# Patient Record
Sex: Female | Born: 1990 | Race: Black or African American | Hispanic: No | Marital: Single | Smoking: Never smoker
Health system: Southern US, Community
[De-identification: ages and names within clinical notes are randomized; demographics above are authoritative.]

## PROBLEM LIST (undated history)

## (undated) ENCOUNTER — Inpatient Hospital Stay (HOSPITAL_COMMUNITY): Payer: Self-pay

## (undated) DIAGNOSIS — D649 Anemia, unspecified: Secondary | ICD-10-CM

## (undated) HISTORY — PX: UMBILICAL HERNIA REPAIR: SHX196

## (undated) HISTORY — PX: OOPHORECTOMY: SHX86

---
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
## 2008-06-23 ENCOUNTER — Emergency Department (HOSPITAL_COMMUNITY): Admission: EM | Admit: 2008-06-23 | Discharge: 2008-06-23 | Payer: Self-pay | Admitting: Emergency Medicine

## 2009-12-01 ENCOUNTER — Inpatient Hospital Stay (HOSPITAL_COMMUNITY): Admission: AD | Admit: 2009-12-01 | Discharge: 2009-12-01 | Payer: Self-pay | Admitting: Obstetrics and Gynecology

## 2010-02-04 ENCOUNTER — Inpatient Hospital Stay (HOSPITAL_COMMUNITY): Admission: AD | Admit: 2010-02-04 | Discharge: 2010-02-04 | Payer: Self-pay | Admitting: Obstetrics and Gynecology

## 2010-02-13 ENCOUNTER — Inpatient Hospital Stay (HOSPITAL_COMMUNITY): Admission: AD | Admit: 2010-02-13 | Discharge: 2010-02-15 | Payer: Self-pay | Admitting: Obstetrics and Gynecology

## 2010-04-30 ENCOUNTER — Emergency Department (HOSPITAL_COMMUNITY): Admission: EM | Admit: 2010-04-30 | Discharge: 2009-12-17 | Payer: Self-pay | Admitting: Emergency Medicine

## 2010-08-06 LAB — CBC
HCT: 31.2 % — ABNORMAL LOW (ref 36.0–46.0)
MCH: 24.5 pg — ABNORMAL LOW (ref 26.0–34.0)
MCHC: 33.4 g/dL (ref 30.0–36.0)
MCV: 73.4 fL — ABNORMAL LOW (ref 78.0–100.0)
RDW: 16.6 % — ABNORMAL HIGH (ref 11.5–15.5)

## 2010-08-09 LAB — WET PREP, GENITAL: Yeast Wet Prep HPF POC: NONE SEEN

## 2011-02-15 LAB — CBC
HCT: 30.1 — ABNORMAL LOW
Hemoglobin: 12
MCV: 75.1 — ABNORMAL LOW
MCV: 75.8 — ABNORMAL LOW
Platelets: 201
RBC: 4.88
RDW: 15.4
WBC: 12.2 — ABNORMAL HIGH

## 2014-05-24 NOTE — L&D Delivery Note (Signed)
Pt was admitted after two decels were noted in the office. She was admitted and started on Pit. She had AROM. She followed a nl labor curve. She pushed for 20 min. She had a SVD of one live viable black infant over an intact perineum in the ROA position. Placenta S/I. Uterine atony was txd with massage/pit/methergine . Baby to NBN. True knot noted in the umbilical cord.

## 2014-07-02 ENCOUNTER — Encounter (HOSPITAL_COMMUNITY): Payer: Self-pay

## 2014-07-02 ENCOUNTER — Emergency Department (HOSPITAL_COMMUNITY)
Admission: EM | Admit: 2014-07-02 | Discharge: 2014-07-02 | Disposition: A | Discharge status: Home / Self Care | Payer: Medicaid Other | Attending: Emergency Medicine | Admitting: Emergency Medicine

## 2014-07-02 DIAGNOSIS — Z3A01 Less than 8 weeks gestation of pregnancy: Secondary | ICD-10-CM | POA: Insufficient documentation

## 2014-07-02 DIAGNOSIS — O9A211 Injury, poisoning and certain other consequences of external causes complicating pregnancy, first trimester: Secondary | ICD-10-CM | POA: Insufficient documentation

## 2014-07-02 DIAGNOSIS — S3993XA Unspecified injury of pelvis, initial encounter: Secondary | ICD-10-CM | POA: Insufficient documentation

## 2014-07-02 DIAGNOSIS — Y92410 Unspecified street and highway as the place of occurrence of the external cause: Secondary | ICD-10-CM | POA: Diagnosis not present

## 2014-07-02 DIAGNOSIS — Z79899 Other long term (current) drug therapy: Secondary | ICD-10-CM | POA: Diagnosis not present

## 2014-07-02 DIAGNOSIS — R102 Pelvic and perineal pain: Secondary | ICD-10-CM

## 2014-07-02 DIAGNOSIS — O26899 Other specified pregnancy related conditions, unspecified trimester: Secondary | ICD-10-CM

## 2014-07-02 DIAGNOSIS — Y998 Other external cause status: Secondary | ICD-10-CM | POA: Insufficient documentation

## 2014-07-02 DIAGNOSIS — Y9389 Activity, other specified: Secondary | ICD-10-CM | POA: Insufficient documentation

## 2014-07-02 MED ORDER — ACETAMINOPHEN 325 MG PO TABS
650.0000 mg | ORAL_TABLET | Freq: Once | ORAL | Status: AC
  Administered 2014-07-02: 650 mg via ORAL
  Filled 2014-07-02: qty 2
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
## 2014-07-02 NOTE — Discharge Instructions (Signed)
If you were given medicines take as directed.  If you are on coumadin or contraceptives realize their levels and effectiveness is altered by many different medicines.  If you have any reaction (rash, tongues swelling, other) to the medicines stop taking and see a physician.   Please follow up as directed and return to the ER or see a physician for new or worsening symptoms.  Thank you. Filed Vitals:   07/02/14 1120 07/02/14 1346  BP: 125/60 109/62  Pulse: 96 89  Temp: 98.1 F (36.7 C) 98.4 F (36.9 C)  TempSrc: Oral Oral  Resp: 16 16  SpO2: 99% 100%

## 2014-07-02 NOTE — ED Notes (Signed)
Pt alert x4 respirations easy non labored.  

## 2014-08-06 LAB — OB RESULTS CONSOLE GC/CHLAMYDIA
Chlamydia: NEGATIVE
GC PROBE AMP, GENITAL: NEGATIVE

## 2014-08-06 LAB — OB RESULTS CONSOLE ANTIBODY SCREEN: ANTIBODY SCREEN: NEGATIVE

## 2014-08-06 LAB — OB RESULTS CONSOLE ABO/RH

## 2014-08-06 LAB — OB RESULTS CONSOLE HGB/HCT, BLOOD
HCT: 35 %
Hemoglobin: 10.7 g/dL

## 2014-08-06 LAB — OB RESULTS CONSOLE RUBELLA ANTIBODY, IGM: RUBELLA: IMMUNE

## 2014-08-06 LAB — OB RESULTS CONSOLE HIV ANTIBODY (ROUTINE TESTING): HIV: NONREACTIVE

## 2014-08-06 LAB — OB RESULTS CONSOLE PLATELET COUNT: PLATELETS: 215 10*3/uL

## 2014-08-06 LAB — OB RESULTS CONSOLE HEPATITIS B SURFACE ANTIGEN: Hepatitis B Surface Ag: NEGATIVE

## 2014-08-06 LAB — OB RESULTS CONSOLE RPR: RPR: NONREACTIVE

## 2014-09-25 LAB — OB RESULTS CONSOLE PLATELET COUNT: Platelets: 232 10*3/uL

## 2014-09-25 LAB — OB RESULTS CONSOLE HGB/HCT, BLOOD
HEMATOCRIT: 30 %
Hemoglobin: 10 g/dL

## 2014-09-25 LAB — OB RESULTS CONSOLE RPR: RPR: NONREACTIVE

## 2014-11-14 LAB — OB RESULTS CONSOLE GBS: GBS: NEGATIVE

## 2014-11-19 ENCOUNTER — Inpatient Hospital Stay (HOSPITAL_COMMUNITY)
Admission: AD | Admit: 2014-11-19 | Discharge: 2014-11-19 | Disposition: A | Discharge status: Home / Self Care | Payer: Medicaid Other | Source: Ambulatory Visit | Attending: Obstetrics and Gynecology | Admitting: Obstetrics and Gynecology

## 2014-11-19 ENCOUNTER — Encounter (HOSPITAL_COMMUNITY): Payer: Self-pay | Admitting: *Deleted

## 2014-11-19 DIAGNOSIS — K529 Noninfective gastroenteritis and colitis, unspecified: Secondary | ICD-10-CM

## 2014-11-19 DIAGNOSIS — Z3A35 35 weeks gestation of pregnancy: Secondary | ICD-10-CM | POA: Insufficient documentation

## 2014-11-19 DIAGNOSIS — A084 Viral intestinal infection, unspecified: Secondary | ICD-10-CM | POA: Diagnosis not present

## 2014-11-19 DIAGNOSIS — O212 Late vomiting of pregnancy: Secondary | ICD-10-CM | POA: Diagnosis present

## 2014-11-19 DIAGNOSIS — O99613 Diseases of the digestive system complicating pregnancy, third trimester: Secondary | ICD-10-CM | POA: Insufficient documentation

## 2014-11-19 HISTORY — DX: Anemia, unspecified: D64.9

## 2014-11-19 LAB — URINALYSIS, ROUTINE W REFLEX MICROSCOPIC
Bilirubin Urine: NEGATIVE
GLUCOSE, UA: NEGATIVE mg/dL
Hgb urine dipstick: NEGATIVE
KETONES UR: NEGATIVE mg/dL
Nitrite: NEGATIVE
PH: 7.5 (ref 5.0–8.0)
Protein, ur: NEGATIVE mg/dL
SPECIFIC GRAVITY, URINE: 1.015 (ref 1.005–1.030)
Urobilinogen, UA: 0.2 mg/dL (ref 0.0–1.0)

## 2014-11-19 LAB — URINE MICROSCOPIC-ADD ON

## 2014-11-19 MED ORDER — PROMETHAZINE HCL 25 MG PO TABS: 25.0000 mg | ORAL_TABLET | Freq: Four times a day (QID) | ORAL | Status: DC | PRN

## 2014-11-19 MED ORDER — METOCLOPRAMIDE HCL 5 MG/ML IJ SOLN
10.0000 mg | Freq: Once | INTRAMUSCULAR | Status: AC
  Administered 2014-11-19: 10 mg via INTRAVENOUS
  Filled 2014-11-19: qty 2

## 2014-11-19 MED ORDER — LACTATED RINGERS IV BOLUS (SEPSIS)
1000.0000 mL | Freq: Once | INTRAVENOUS | Status: AC
  Administered 2014-11-19: 1000 mL via INTRAVENOUS

## 2014-11-19 MED ORDER — ONDANSETRON 8 MG PO TBDP
8.0000 mg | ORAL_TABLET | Freq: Once | ORAL | Status: AC
  Administered 2014-11-19: 8 mg via ORAL
  Filled 2014-11-19: qty 1

## 2014-11-19 NOTE — MAU Note (Signed)
Pt sitting up in bed and FHR not recording well.

## 2014-11-19 NOTE — Progress Notes (Signed)
Pt vomited after drinking ginger ale. No diarrhea since admission

## 2014-11-19 NOTE — MAU Note (Signed)
Unable to obtain urine for u/a currently

## 2014-11-19 NOTE — Progress Notes (Signed)
Ginger ale to pt

## 2014-11-19 NOTE — MAU Note (Signed)
N/v since last THurs. Diarrhea started Friday. Unable to keep down anything. Some pelvic pressure and lower back pain. No one else has been sick with similar symptoms

## 2014-11-19 NOTE — Progress Notes (Signed)
Written and verbal d/c instructions given and understanding voiced. 

## 2014-11-19 NOTE — MAU Note (Signed)
Pt up to BR. OK to d/c EFM per S. Chase PicketLineberry NP

## 2014-11-19 NOTE — MAU Provider Note (Signed)
History     CSN: [id number]  Arrival date and time: 11/19/14 1051   None     Chief Complaint  Patient presents with  . Emesis During Pregnancy  . Diarrhea   HPIpt is G4P3 [redacted]w[redacted]d pregnant who presents with N/V/D along with pelvic pressure and lower back pain. Pt denies bleeding or LOF RN note:  MAU Note 11/19/2014 11:37 AM    Expand All Collapse All   N/v since last THurs. Diarrhea started Friday. Unable to keep down anything. Some pelvic pressure and lower back pain. No one else has been sick with similar symptoms         Past Medical History  Diagnosis Date  . Anemia     Past Surgical History  Procedure Laterality Date  . Umbilical hernia repair      Family History  Problem Relation Age of Onset  . Hypertension Mother   . Asthma Daughter     History  Substance Use Topics  . Smoking status: Never Smoker   . Smokeless tobacco: Not on file  . Alcohol Use: No    Allergies: No Known Allergies  Prescriptions prior to admission  Medication Sig Dispense Refill Last Dose  . ferrous sulfate 325 (65 FE) MG tablet Take 325 mg by mouth daily with breakfast.   11/13/2014  . Multiple Vitamins-Minerals (MULTI ADULT GUMMIES PO) Take 1 capsule by mouth daily.   11/13/2014    Review of Systems  Constitutional: Negative for fever and chills.  Gastrointestinal: Positive for nausea, vomiting and diarrhea. Negative for abdominal pain.  Genitourinary: Negative for dysuria, urgency and frequency.  Neurological: Negative for headaches.   Physical Exam   Blood pressure 126/73, pulse 108, temperature 98.5 F (36.9 C), resp. rate 18, height  (1.702 m), weight 217 lb (98.431 kg).  Physical Exam  Nursing note and vitals reviewed. Constitutional: She is oriented to person, place, and time. She appears well-developed and well-nourished. No distress.  HENT:  Head: Normocephalic.  Eyes: Pupils are equal, round, and reactive to light.  Neck: Normal range of motion. Neck  supple.  Cardiovascular: Normal rate.   Respiratory: Effort normal.  GI: Soft. She exhibits no distension. There is no tenderness. There is no guarding.  Musculoskeletal: Normal range of motion.  Neurological: She is alert and oriented to person, place, and time.  Skin: Skin is warm and dry.  Psychiatric: She has a normal mood and affect.    MAU Course  Procedures IV LR Zofran  IV- still nauseated vomited  gingerale Reglan  IV- pt felt better No diarrhea since being in MAU Reactive NST ; some irritability Results for orders placed or performed during the hospital encounter of 11/19/14 (from the past 24 hour(s))  Urinalysis, Routine w reflex microscopic (not at [facility])     Status: Abnormal   Collection Time: 11/19/14  1:45 PM  Result Value Ref Range   Color, Urine YELLOW YELLOW   APPearance CLEAR CLEAR   Specific Gravity, Urine 1.015 1.005 - 1.030   pH 7.5 5.0 - 8.0   Glucose, UA NEGATIVE NEGATIVE mg/dL   Hgb urine dipstick NEGATIVE NEGATIVE   Bilirubin Urine NEGATIVE NEGATIVE   Ketones, ur NEGATIVE NEGATIVE mg/dL   Protein, ur NEGATIVE NEGATIVE mg/dL   Urobilinogen, UA 0.2 0.0 - 1.0 mg/dL   Nitrite NEGATIVE NEGATIVE   Leukocytes, UA TRACE (A) NEGATIVE  Urine microscopic-add on     Status: Abnormal   Collection Time: 11/19/14  1:45 PM  Result Value Ref  Range   Squamous Epithelial / LPF FEW (A) RARE   WBC, UA 0-2 <3 WBC/hpf   Bacteria, UA FEW (A) RARE  discussed with Dr. Henderson CloudHorvath Assessment and Plan  Viral gastroenteritis- phenergan 25mg  Rx if needed BRAT diet F/u with OB appointment on thursday   Hall,Meagan 11/19/2014, 11:49 AM

## 2014-12-06 LAB — OB RESULTS CONSOLE ABO/RH: RH TYPE: POSITIVE

## 2014-12-20 ENCOUNTER — Inpatient Hospital Stay (HOSPITAL_COMMUNITY): Payer: Medicaid Other | Admitting: Anesthesiology

## 2014-12-20 ENCOUNTER — Encounter (HOSPITAL_COMMUNITY): Payer: Self-pay

## 2014-12-20 ENCOUNTER — Inpatient Hospital Stay (HOSPITAL_COMMUNITY)
Admission: AD | Admit: 2014-12-20 | Discharge: 2014-12-22 | DRG: 775 | Disposition: A | Discharge status: Home / Self Care | Payer: Medicaid Other | Source: Ambulatory Visit | Attending: Obstetrics and Gynecology | Admitting: Obstetrics and Gynecology

## 2014-12-20 DIAGNOSIS — Z3480 Encounter for supervision of other normal pregnancy, unspecified trimester: Secondary | ICD-10-CM

## 2014-12-20 DIAGNOSIS — Z3A40 40 weeks gestation of pregnancy: Secondary | ICD-10-CM | POA: Diagnosis present

## 2014-12-20 DIAGNOSIS — Z3490 Encounter for supervision of normal pregnancy, unspecified, unspecified trimester: Secondary | ICD-10-CM

## 2014-12-20 LAB — CBC
HCT: 28.5 % — ABNORMAL LOW (ref 36.0–46.0)
Hemoglobin: 9.1 g/dL — ABNORMAL LOW (ref 12.0–15.0)
MCH: 20.9 pg — ABNORMAL LOW (ref 26.0–34.0)
MCHC: 31.9 g/dL (ref 30.0–36.0)
MCV: 65.4 fL — AB (ref 78.0–100.0)
Platelets: 174 10*3/uL (ref 150–400)
RBC: 4.36 MIL/uL (ref 3.87–5.11)
RDW: 17.7 % — AB (ref 11.5–15.5)
WBC: 7.8 10*3/uL (ref 4.0–10.5)

## 2014-12-20 LAB — TYPE AND SCREEN
ABO/RH(D): O POS
Antibody Screen: NEGATIVE

## 2014-12-20 LAB — ABO/RH: ABO/RH(D): O POS

## 2014-12-20 MED ORDER — CITRIC ACID-SODIUM CITRATE 334-500 MG/5ML PO SOLN: 30.0000 mL | ORAL | Status: DC | PRN

## 2014-12-20 MED ORDER — OXYTOCIN 40 UNITS IN LACTATED RINGERS INFUSION - SIMPLE MED: 62.5000 mL/h | INTRAVENOUS | Status: DC

## 2014-12-20 MED ORDER — OXYTOCIN 40 UNITS IN LACTATED RINGERS INFUSION - SIMPLE MED
1.0000 m[IU]/min | INTRAVENOUS | Status: DC
  Administered 2014-12-20: 2 m[IU]/min via INTRAVENOUS
  Filled 2014-12-20: qty 1000

## 2014-12-20 MED ORDER — PHENYLEPHRINE 40 MCG/ML (10ML) SYRINGE FOR IV PUSH (FOR BLOOD PRESSURE SUPPORT)
80.0000 ug | PREFILLED_SYRINGE | INTRAVENOUS | Status: DC | PRN
  Filled 2014-12-20: qty 2
  Filled 2014-12-20: qty 20

## 2014-12-20 MED ORDER — LACTATED RINGERS IV SOLN
INTRAVENOUS | Status: DC
  Administered 2014-12-20 (×2): via INTRAVENOUS

## 2014-12-20 MED ORDER — ONDANSETRON HCL 4 MG/2ML IJ SOLN: 4.0000 mg | Freq: Four times a day (QID) | INTRAMUSCULAR | Status: DC | PRN

## 2014-12-20 MED ORDER — OXYCODONE-ACETAMINOPHEN 5-325 MG PO TABS: 2.0000 | ORAL_TABLET | ORAL | Status: DC | PRN

## 2014-12-20 MED ORDER — LIDOCAINE HCL (PF) 1 % IJ SOLN
INTRAMUSCULAR | Status: DC | PRN
  Administered 2014-12-20 (×2): 8 mL via EPIDURAL

## 2014-12-20 MED ORDER — EPHEDRINE 5 MG/ML INJ
10.0000 mg | INTRAVENOUS | Status: DC | PRN
  Filled 2014-12-20: qty 2

## 2014-12-20 MED ORDER — OXYTOCIN BOLUS FROM INFUSION: 500.0000 mL | INTRAVENOUS | Status: DC

## 2014-12-20 MED ORDER — ACETAMINOPHEN 325 MG PO TABS: 650.0000 mg | ORAL_TABLET | ORAL | Status: DC | PRN

## 2014-12-20 MED ORDER — LACTATED RINGERS IV SOLN: 500.0000 mL | INTRAVENOUS | Status: DC | PRN

## 2014-12-20 MED ORDER — FENTANYL 2.5 MCG/ML BUPIVACAINE 1/10 % EPIDURAL INFUSION (WH - ANES)
14.0000 mL/h | INTRAMUSCULAR | Status: DC | PRN
  Administered 2014-12-20 – 2014-12-21 (×2): 14 mL/h via EPIDURAL
  Filled 2014-12-20 (×2): qty 125

## 2014-12-20 MED ORDER — LIDOCAINE HCL (PF) 1 % IJ SOLN
30.0000 mL | INTRAMUSCULAR | Status: DC | PRN
  Filled 2014-12-20: qty 30

## 2014-12-20 MED ORDER — DIPHENHYDRAMINE HCL 50 MG/ML IJ SOLN: 12.5000 mg | INTRAMUSCULAR | Status: DC | PRN

## 2014-12-20 MED ORDER — OXYCODONE-ACETAMINOPHEN 5-325 MG PO TABS: 1.0000 | ORAL_TABLET | ORAL | Status: DC | PRN

## 2014-12-20 MED ORDER — TERBUTALINE SULFATE 1 MG/ML IJ SOLN: 0.2500 mg | Freq: Once | INTRAMUSCULAR | Status: AC | PRN

## 2014-12-20 NOTE — Progress Notes (Signed)
Light brown noted on towel- RN changed towel- and will assess. Not wet- no fluid noted.

## 2014-12-20 NOTE — Anesthesia Procedure Notes (Signed)
Epidural Patient location during procedure: OB Start time: 12/20/2014 11:24 PM End time: 12/20/2014 11:28 PM  Staffing Anesthesiologist: Leilani Able Performed by: anesthesiologist   Preanesthetic Checklist Completed: patient identified, surgical consent, pre-op evaluation, timeout performed, IV checked, risks and benefits discussed and monitors and equipment checked  Epidural Patient position: sitting Prep: site prepped and draped and DuraPrep Patient monitoring: continuous pulse ox and blood pressure Approach: midline Location: L3-L4 Injection technique: LOR air  Needle:  Needle type: Tuohy  Needle gauge: 17 G Needle length: 9 cm and 9 Needle insertion depth: 6 cm Catheter type: closed end flexible Catheter size: 19 Gauge Catheter at skin depth: 11 cm Test dose: negative and Other  Assessment Sensory level: T9 Events: blood not aspirated, injection not painful, no injection resistance, negative IV test and no paresthesia  Additional Notes Reason for block:procedure for pain

## 2014-12-20 NOTE — Anesthesia Preprocedure Evaluation (Signed)
Anesthesia Evaluation  Patient identified by MRN, date of birth, ID band Patient awake    Reviewed: Allergy & Precautions, H&P , NPO status , Patient's Chart, lab work & pertinent test results  Airway Mallampati: II  TM Distance: >3 FB Neck ROM: full    Dental no notable dental hx. (+) Teeth Intact   Pulmonary neg pulmonary ROS,    Pulmonary exam normal       Cardiovascular negative cardio ROS Normal cardiovascular exam    Neuro/Psych negative neurological ROS  negative psych ROS   GI/Hepatic negative GI ROS, Neg liver ROS,   Endo/Other  negative endocrine ROS  Renal/GU negative Renal ROS     Musculoskeletal   Abdominal (+) + obese,   Peds  Hematology negative hematology ROS (+)   Anesthesia Other Findings   Reproductive/Obstetrics negative OB ROS (+) Pregnancy                             Anesthesia Physical Anesthesia Plan  ASA: II  Anesthesia Plan: Epidural   Post-op Pain Management:    Induction:   Airway Management Planned:   Additional Equipment:   Intra-op Plan:   Post-operative Plan:   Informed Consent: I have reviewed the patients History and Physical, chart, labs and discussed the procedure including the risks, benefits and alternatives for the proposed anesthesia with the patient or authorized representative who has indicated his/her understanding and acceptance.     Plan Discussed with:   Anesthesia Plan Comments:         Anesthesia Quick Evaluation

## 2014-12-20 NOTE — H&P (Signed)
Pt is a 24 y/o black female who is admitted for an induction because she had two decels on an NST today. PNC was uncomplicated. PMHX: see PNR PE: VSSAF        HEENT-wnl        ABD- gravid, non tender        FHTS- no reactive IMP/ IUP at term         Decels on NST PLAN/ Admit            Start Induction

## 2014-12-21 ENCOUNTER — Encounter (HOSPITAL_COMMUNITY): Payer: Self-pay | Admitting: *Deleted

## 2014-12-21 DIAGNOSIS — Z3480 Encounter for supervision of other normal pregnancy, unspecified trimester: Secondary | ICD-10-CM

## 2014-12-21 LAB — RPR: RPR Ser Ql: NONREACTIVE

## 2014-12-21 MED ORDER — ZOLPIDEM TARTRATE 5 MG PO TABS: 5.0000 mg | ORAL_TABLET | Freq: Every evening | ORAL | Status: DC | PRN

## 2014-12-21 MED ORDER — ONDANSETRON HCL 4 MG/2ML IJ SOLN: 4.0000 mg | INTRAMUSCULAR | Status: DC | PRN

## 2014-12-21 MED ORDER — SENNOSIDES-DOCUSATE SODIUM 8.6-50 MG PO TABS
2.0000 | ORAL_TABLET | ORAL | Status: DC
  Administered 2014-12-21: 2 via ORAL
  Filled 2014-12-21: qty 2

## 2014-12-21 MED ORDER — IBUPROFEN 600 MG PO TABS
600.0000 mg | ORAL_TABLET | Freq: Four times a day (QID) | ORAL | Status: DC
  Administered 2014-12-21 – 2014-12-22 (×5): 600 mg via ORAL
  Filled 2014-12-21 (×5): qty 1

## 2014-12-21 MED ORDER — WITCH HAZEL-GLYCERIN EX PADS: 1.0000 "application " | MEDICATED_PAD | CUTANEOUS | Status: DC | PRN

## 2014-12-21 MED ORDER — MISOPROSTOL 200 MCG PO TABS
ORAL_TABLET | ORAL | Status: AC
  Filled 2014-12-21: qty 5

## 2014-12-21 MED ORDER — TETANUS-DIPHTH-ACELL PERTUSSIS 5-2.5-18.5 LF-MCG/0.5 IM SUSP: 0.5000 mL | Freq: Once | INTRAMUSCULAR | Status: DC

## 2014-12-21 MED ORDER — ONDANSETRON HCL 4 MG PO TABS: 4.0000 mg | ORAL_TABLET | ORAL | Status: DC | PRN

## 2014-12-21 MED ORDER — DIBUCAINE 1 % RE OINT: 1.0000 "application " | TOPICAL_OINTMENT | RECTAL | Status: DC | PRN

## 2014-12-21 MED ORDER — MEASLES, MUMPS & RUBELLA VAC ~~LOC~~ INJ
0.5000 mL | INJECTION | Freq: Once | SUBCUTANEOUS | Status: DC
  Filled 2014-12-21: qty 0.5

## 2014-12-21 MED ORDER — ACETAMINOPHEN 325 MG PO TABS
650.0000 mg | ORAL_TABLET | ORAL | Status: DC | PRN
Start: 2014-12-21 — End: 2014-12-22

## 2014-12-21 MED ORDER — SIMETHICONE 80 MG PO CHEW: 80.0000 mg | CHEWABLE_TABLET | ORAL | Status: DC | PRN

## 2014-12-21 MED ORDER — METHYLERGONOVINE MALEATE 0.2 MG/ML IJ SOLN
INTRAMUSCULAR | Status: AC
  Administered 2014-12-21: 0.2 mg
  Filled 2014-12-21: qty 1

## 2014-12-21 MED ORDER — BENZOCAINE-MENTHOL 20-0.5 % EX AERO: 1.0000 "application " | INHALATION_SPRAY | CUTANEOUS | Status: DC | PRN

## 2014-12-21 MED ORDER — OXYCODONE-ACETAMINOPHEN 5-325 MG PO TABS: 2.0000 | ORAL_TABLET | ORAL | Status: DC | PRN

## 2014-12-21 MED ORDER — CARBOPROST TROMETHAMINE 250 MCG/ML IM SOLN
INTRAMUSCULAR | Status: AC
  Filled 2014-12-21: qty 1

## 2014-12-21 MED ORDER — OXYCODONE-ACETAMINOPHEN 5-325 MG PO TABS
1.0000 | ORAL_TABLET | ORAL | Status: DC | PRN
  Administered 2014-12-21: 1 via ORAL
  Filled 2014-12-21: qty 1

## 2014-12-22 LAB — CBC
HCT: 23.3 % — ABNORMAL LOW (ref 36.0–46.0)
Hemoglobin: 7.4 g/dL — ABNORMAL LOW (ref 12.0–15.0)
MCH: 20.8 pg — AB (ref 26.0–34.0)
MCHC: 31.8 g/dL (ref 30.0–36.0)
MCV: 65.4 fL — AB (ref 78.0–100.0)
Platelets: 161 10*3/uL (ref 150–400)
RBC: 3.56 MIL/uL — AB (ref 3.87–5.11)
RDW: 17.7 % — ABNORMAL HIGH (ref 11.5–15.5)
WBC: 10.6 10*3/uL — ABNORMAL HIGH (ref 4.0–10.5)

## 2014-12-22 MED ORDER — OXYCODONE-ACETAMINOPHEN 5-325 MG PO TABS: 1.0000 | ORAL_TABLET | ORAL | Status: DC | PRN

## 2014-12-22 NOTE — Progress Notes (Signed)
PPD#1 Pt is doing well She would like to go home.  Plan/ Will discharge.

## 2014-12-22 NOTE — Progress Notes (Signed)
Discharge teaching complete. Pt understood all instructions and did not have any questions. Pt ambulated out of the hospital and discharged home to family.  

## 2014-12-22 NOTE — Anesthesia Postprocedure Evaluation (Signed)
  Anesthesia Post-op Note  Patient: Meagan Hall  Procedure(s) Performed: * No procedures listed *  Patient Location: PACU and Mother/Baby  Anesthesia Type:Epidural  Level of Consciousness: awake, alert  and oriented  Airway and Oxygen Therapy: Patient Spontanous Breathing  Post-op Pain: none  Post-op Assessment: Post-op Vital signs reviewed, Patient's Cardiovascular Status Stable, No headache, No backache, No residual numbness and No residual motor weakness  Post-op Vital Signs: Reviewed and stable  Complications: No apparent anesthesia complications

## 2014-12-22 NOTE — Discharge Summary (Signed)
Obstetric Discharge Summary Reason for Admission: induction of labor Prenatal Procedures: ultrasound Intrapartum Procedures: spontaneous vaginal delivery Postpartum Procedures: none Complications-Operative and Postpartum: none HEMOGLOBIN  Date Value Ref Range Status  12/22/2014 7.4* 12.0 - 15.0 g/dL Final  16/02/9603 54.0 g/dL Final   HCT  Date Value Ref Range Status  12/22/2014 23.3* 36.0 - 46.0 % Final  09/25/2014 30 % Final    Physical Exam:  General: alert Lochia: appropriate Uterine Fundus: firm  Discharge Diagnoses: Term Pregnancy-delivered  Discharge Information: Date: 12/22/2014 Activity: pelvic rest Diet: routine Medications: PNV, Ibuprofen and Percocet Condition: stable Instructions: refer to practice specific booklet Discharge to: home Follow-up Information    Follow up with Levi Aland, MD. Schedule an appointment as soon as possible for a visit in 1 month.   Specialty:  Obstetrics and Gynecology   Contact information:   [address] [place] [address] [phone]       Newborn Data: Live born female  Birth Weight: 8 lb 10.1 oz (3915 g) APGAR: 9, 9  Home with mother.  Kameo Bains E 12/22/2014, 9:24 AM

## 2016-10-27 ENCOUNTER — Inpatient Hospital Stay (HOSPITAL_COMMUNITY): Payer: Medicaid Other

## 2016-10-27 ENCOUNTER — Inpatient Hospital Stay (HOSPITAL_COMMUNITY)
Admission: AD | Admit: 2016-10-27 | Discharge: 2016-10-27 | Disposition: A | Discharge status: Home / Self Care | Payer: Medicaid Other | Source: Ambulatory Visit | Attending: Obstetrics & Gynecology | Admitting: Obstetrics & Gynecology

## 2016-10-27 ENCOUNTER — Encounter (HOSPITAL_COMMUNITY): Payer: Self-pay | Admitting: *Deleted

## 2016-10-27 DIAGNOSIS — O208 Other hemorrhage in early pregnancy: Secondary | ICD-10-CM | POA: Diagnosis not present

## 2016-10-27 DIAGNOSIS — B9689 Other specified bacterial agents as the cause of diseases classified elsewhere: Secondary | ICD-10-CM

## 2016-10-27 DIAGNOSIS — N760 Acute vaginitis: Secondary | ICD-10-CM | POA: Diagnosis not present

## 2016-10-27 DIAGNOSIS — O219 Vomiting of pregnancy, unspecified: Secondary | ICD-10-CM | POA: Insufficient documentation

## 2016-10-27 DIAGNOSIS — Z3A01 Less than 8 weeks gestation of pregnancy: Secondary | ICD-10-CM | POA: Insufficient documentation

## 2016-10-27 DIAGNOSIS — O9989 Other specified diseases and conditions complicating pregnancy, childbirth and the puerperium: Secondary | ICD-10-CM | POA: Diagnosis not present

## 2016-10-27 DIAGNOSIS — O200 Threatened abortion: Secondary | ICD-10-CM

## 2016-10-27 DIAGNOSIS — O209 Hemorrhage in early pregnancy, unspecified: Secondary | ICD-10-CM

## 2016-10-27 LAB — URINALYSIS, ROUTINE W REFLEX MICROSCOPIC
Bilirubin Urine: NEGATIVE
Glucose, UA: NEGATIVE mg/dL
Hgb urine dipstick: NEGATIVE
Ketones, ur: NEGATIVE mg/dL
LEUKOCYTES UA: NEGATIVE
NITRITE: NEGATIVE
PH: 8 (ref 5.0–8.0)
Protein, ur: NEGATIVE mg/dL
Specific Gravity, Urine: 1.01 (ref 1.005–1.030)

## 2016-10-27 LAB — CBC
HEMATOCRIT: 33.4 % — AB (ref 36.0–46.0)
HEMOGLOBIN: 11 g/dL — AB (ref 12.0–15.0)
MCH: 21.8 pg — ABNORMAL LOW (ref 26.0–34.0)
MCHC: 32.9 g/dL (ref 30.0–36.0)
MCV: 66.3 fL — ABNORMAL LOW (ref 78.0–100.0)
Platelets: 275 10*3/uL (ref 150–400)
RBC: 5.04 MIL/uL (ref 3.87–5.11)
RDW: 16.8 % — ABNORMAL HIGH (ref 11.5–15.5)
WBC: 7.2 10*3/uL (ref 4.0–10.5)

## 2016-10-27 LAB — WET PREP, GENITAL
Sperm: NONE SEEN
TRICH WET PREP: NONE SEEN
Yeast Wet Prep HPF POC: NONE SEEN

## 2016-10-27 LAB — POCT PREGNANCY, URINE: Preg Test, Ur: POSITIVE — AB

## 2016-10-27 LAB — HCG, QUANTITATIVE, PREGNANCY: hCG, Beta Chain, Quant, S: 3891 m[IU]/mL — ABNORMAL HIGH (ref <5)

## 2016-10-27 MED ORDER — PROMETHAZINE HCL 25 MG PO TABS: 12.5000 mg | ORAL_TABLET | Freq: Four times a day (QID) | ORAL | 2 refills | Status: DC | PRN

## 2016-10-27 MED ORDER — CLINDAMYCIN HCL 300 MG PO CAPS
300.0000 mg | ORAL_CAPSULE | Freq: Three times a day (TID) | ORAL | 0 refills | Status: DC
Start: 2016-10-27 — End: 2017-10-09

## 2016-10-27 NOTE — MAU Note (Signed)
+  vomiting Emesis in last 24 hours about 6 times States unable to eat or drink  +abdominal cramping since Friday Rating pain 8/10 Has not taken anything for the pain  +vaginal bleeding last Friday, bright red--no bleeding today   LMP 09/11/16

## 2016-10-27 NOTE — MAU Provider Note (Signed)
Chief Complaint:  Emesis and Abdominal Cramping   First Provider Initiated Contact with Patient 10/27/16 1325      HPI: Meagan Hall is a 26 y.o. G5P4004 at 43w4dwho presents to maternity admissions reporting onset of abdominal cramping and nausea/vomiting 6 days ago with vaginal bleeding 4-5 days ago.  Cramping and nausea/vomiting continue daily but bleeding resolved without treatment so there is no bleeding today.  Bleeding was light, red/pink when wiping only.  She has not tried any treatments for nausea. She is drinking sports drinks which she vomits, then drinks more to try to keep from becoming dehydrated.  There are no other associated symptoms. She denies LOF, vaginal itching/burning, urinary symptoms, h/a, dizziness, n/v, or fever/chills.    HPI  Past Medical History: Past Medical History:  Diagnosis Date  . Anemia     Past obstetric history: OB History  Gravida Para Term Preterm AB Living  5 4 4     4   SAB TAB Ectopic Multiple Live Births        0 4    # Outcome Date GA Lbr Len/2nd Weight Sex Delivery Anes PTL Lv  5 Current           4 Term 12/21/14 [redacted]w[redacted]d / 00:42 8 lb 10.1 oz (3.915 kg) M Vag-Spont EPI  LIV     Birth Comments: Hgb, normal, FA Newborn Screen Barcode: [id number] Date Collected: 12/22/2014  3 Term 02/13/10    F    LIV  2 Term 07/30/07    M    LIV  1 Term 08/17/06    Meagan Hall      Past Surgical History: Past Surgical History:  Procedure Laterality Date  . UMBILICAL HERNIA REPAIR      Family History: Family History  Problem Relation Age of Onset  . Hypertension Mother   . Asthma Daughter     Social History: Social History  Substance Use Topics  . Smoking status: Never Smoker  . Smokeless tobacco: Never Used  . Alcohol use No    Allergies: No Known Allergies  Meds:  Prescriptions Prior to Admission  Medication Sig Dispense Refill Last Dose  . Multiple Vitamins-Minerals (MULTI ADULT GUMMIES PO) Take 1 capsule by mouth daily.    10/24/2016    ROS:  Review of Systems  Constitutional: Negative for chills, fatigue and fever.  Respiratory: Negative for shortness of breath.   Cardiovascular: Negative for chest pain.  Gastrointestinal: Positive for nausea and vomiting. Negative for constipation and diarrhea.  Genitourinary: Positive for pelvic pain and vaginal bleeding. Negative for difficulty urinating, dysuria, flank pain, vaginal discharge and vaginal pain.  Neurological: Negative for dizziness and headaches.  Psychiatric/Behavioral: Negative.      I have reviewed patient's Past Medical Hx, Surgical Hx, Family Hx, Social Hx, medications and allergies.   Physical Exam   Patient Vitals for the past 24 hrs:  BP Temp Temp src Pulse Resp SpO2 Weight  10/27/16 1110 121/75 98.4 F (36.9 C) Oral 72 16 100 % 205 lb 1.3 oz (93 kg)   Constitutional: Well-developed, well-nourished female in no acute distress.  Cardiovascular: normal rate Respiratory: normal effort GI: Abd soft, non-tender, gravid appropriate for gestational age.  MS: Extremities nontender, no edema, normal ROM Neurologic: Alert and oriented x 4.  GU: Neg CVAT.  PELVIC EXAM: Cervix pink, visually closed, without lesion, scant white creamy discharge, no bleeding noted, vaginal walls and external genitalia normal Bimanual exam: Cervix 0/long/high, firm, anterior, neg CMT,  uterus nontender, nonenlarged, adnexa without tenderness, enlargement, or mass      Labs: Results for orders placed or performed during the hospital encounter of 10/27/16 (from the past 24 hour(s))  Urinalysis, Routine w reflex microscopic     Status: Abnormal   Collection Time: 10/27/16 11:07 AM  Result Value Ref Range   Color, Urine STRAW (A) YELLOW   APPearance CLEAR CLEAR   Specific Gravity, Urine 1.010 1.005 - 1.030   pH 8.0 5.0 - 8.0   Glucose, UA NEGATIVE NEGATIVE mg/dL   Hgb urine dipstick NEGATIVE NEGATIVE   Bilirubin Urine NEGATIVE NEGATIVE   Ketones, ur NEGATIVE  NEGATIVE mg/dL   Protein, ur NEGATIVE NEGATIVE mg/dL   Nitrite NEGATIVE NEGATIVE   Leukocytes, UA NEGATIVE NEGATIVE  Pregnancy, urine POC     Status: Abnormal   Collection Time: 10/27/16 11:36 AM  Result Value Ref Range   Preg Test, Ur POSITIVE (A) NEGATIVE  CBC     Status: Abnormal   Collection Time: 10/27/16 12:45 PM  Result Value Ref Range   WBC 7.2 4.0 - 10.5 K/uL   RBC 5.04 3.87 - 5.11 MIL/uL   Hemoglobin 11.0 (L) 12.0 - 15.0 g/dL   HCT 16.1 (L) 09.6 - 04.5 %   MCV 66.3 (L) 78.0 - 100.0 fL   MCH 21.8 (L) 26.0 - 34.0 pg   MCHC 32.9 30.0 - 36.0 g/dL   RDW 40.9 (H) 81.1 - 91.4 %   Platelets 275 150 - 400 K/uL  hCG, quantitative, pregnancy     Status: Abnormal   Collection Time: 10/27/16 12:45 PM  Result Value Ref Range   hCG, Beta Chain, Quant, S 3,891 (H) <5 mIU/mL  Wet prep, genital     Status: Abnormal   Collection Time: 10/27/16  1:30 PM  Result Value Ref Range   Yeast Wet Prep HPF POC NONE SEEN NONE SEEN   Trich, Wet Prep NONE SEEN NONE SEEN   Clue Cells Wet Prep HPF POC PRESENT (A) NONE SEEN   WBC, Wet Prep HPF POC MODERATE (A) NONE SEEN   Sperm NONE SEEN       Blood type: O positive  Imaging:  US Ob Comp Less 14 Wks  Result Date: 10/27/2016 CLINICAL DATA:  Hay increased pelvic pain, vomiting, and vaginal bleeding beginning 6 days ago. EXAM: OBSTETRIC <14 WK [place] AND TRANSVAGINAL OB US TECHNIQUE: Both transabdominal and transvaginal ultrasound examinations were performed for complete evaluation of the gestation as well as the maternal uterus, adnexal regions, and pelvic cul-de-sac. Transvaginal technique was performed to assess early pregnancy. COMPARISON:  None. FINDINGS: Intrauterine gestational sac: Single Yolk sac:  Visualized. Embryo:  Not Visualized. MSD: 7  mm   5 w   3  d Subchorionic hemorrhage: Moderate to large subchorionic hemorrhage seen. Maternal uterus/adnexae: 9 mm hypoechoic lesion is seen in the anterior lower uterine segment, suspicious for tiny fibroid.  Small right ovarian corpus luteum noted. Normal appearance of left ovary. No adnexal mass or abnormal free fluid identified. IMPRESSION: Single intrauterine gestational sac measuring 5 weeks 3 days by mean sac diameter. Consider following quantitative beta HCG levels, with followup ultrasound to assess viability in 10 days. Moderate to large subchorionic hemorrhage. Probable tiny less than 1 cm fibroid in anterior lower uterine segment. Electronically Signed   By: Myles Rosenthal M.D.   On: 10/27/2016 14:47   US Ob Transvaginal  Result Date: 10/27/2016 CLINICAL DATA:  Hay increased pelvic pain, vomiting, and vaginal bleeding beginning 6 days  ago. EXAM: OBSTETRIC <14 WK US AND TRANSVAGINAL OB US TECHNIQUE: Both transabdominal and transvaginal ultrasound examinations were performed for complete evaluation of the gestation as well as the maternal uterus, adnexal regions, and pelvic cul-de-sac. Transvaginal technique was performed to assess early pregnancy. COMPARISON:  None. FINDINGS: Intrauterine gestational sac: Single Yolk sac:  Visualized. Embryo:  Not Visualized. MSD: 7  mm   5 w   3  d Subchorionic hemorrhage: Moderate to large subchorionic hemorrhage seen. Maternal uterus/adnexae: 9 mm hypoechoic lesion is seen in the anterior lower uterine segment, suspicious for tiny fibroid. Small right ovarian corpus luteum noted. Normal appearance of left ovary. No adnexal mass or abnormal free fluid identified. IMPRESSION: Single intrauterine gestational sac measuring 5 weeks 3 days by mean sac diameter. Consider following quantitative beta HCG levels, with followup ultrasound to assess viability in 10 days. Moderate to large subchorionic hemorrhage. Probable tiny less than 1 cm fibroid in anterior lower uterine segment. Electronically Signed   By: Myles RosenthalJohn  Stahl M.D.   On: 10/27/2016 14:47    MAU Course/MDM: I have ordered labs (CBC, quant hcg, UA) and US and reviewed results.  IUP confirmed by today's US but large  subchorionic hemorrhage noted Consult Dr Mora ApplPinn with presentation, exam findings and test results. Will treat for BV related to wet prep results and pt [facility] in 10[facility]14 days or return to MAU with heavy bleeding/pain Bleeding precautions reviewed Pt stable at time of discharge.   Assessment: 1. BV (bacterial vaginosis)   2. Vaginal bleeding in pregnancy, first trimester   3. Threatened miscarriage in early pregnancy   4. Nausea and vomiting during pregnancy prior to [redacted] weeks gestation     Plan: Discharge home  Follow-up Information    [facility].   Why:  Please follow up in the office in 10-14 days for appointment and ultrasound. Return to MAU as needed for emergencies. Contact information: [address] [place] [id number] [phone]          Allergies as of 10/27/2016   No Known Allergies     Medication List    TAKE these medications   MULTI ADULT GUMMIES PO Take 1 capsule by mouth daily.   promethazine 25 MG tablet Commonly known as:  PHENERGAN Take 0.5-1 tablets (12.5-25 mg total) by mouth every 6 (six) hours as needed.       Sharen CounterLisa Leftwich-Kirby Certified Nurse-Midwife 10/27/2016 3:35 PM

## 2016-10-28 LAB — HIV ANTIBODY (ROUTINE TESTING W REFLEX): HIV SCREEN 4TH GENERATION: NONREACTIVE

## 2016-10-28 LAB — GC/CHLAMYDIA PROBE AMP (~~LOC~~) NOT AT ARMC
CHLAMYDIA, DNA PROBE: NEGATIVE
NEISSERIA GONORRHEA: NEGATIVE

## 2016-11-04 ENCOUNTER — Other Ambulatory Visit: Payer: Self-pay | Admitting: Obstetrics and Gynecology

## 2016-11-04 LAB — OB RESULTS CONSOLE GC/CHLAMYDIA: Gonorrhea: NEGATIVE

## 2016-11-08 LAB — CYTOLOGY - PAP

## 2016-12-02 LAB — OB RESULTS CONSOLE GC/CHLAMYDIA
CHLAMYDIA, DNA PROBE: NEGATIVE
Gonorrhea: NEGATIVE

## 2016-12-02 LAB — OB RESULTS CONSOLE RUBELLA ANTIBODY, IGM
RUBELLA: IMMUNE
RUBELLA: IMMUNE

## 2016-12-02 LAB — OB RESULTS CONSOLE RPR: RPR: NONREACTIVE

## 2016-12-02 LAB — OB RESULTS CONSOLE HIV ANTIBODY (ROUTINE TESTING)
HIV: NONREACTIVE
HIV: NONREACTIVE

## 2016-12-02 LAB — OB RESULTS CONSOLE ABO/RH: RH Type: POSITIVE

## 2016-12-02 LAB — OB RESULTS CONSOLE HEPATITIS B SURFACE ANTIGEN
HEP B S AG: NEGATIVE
Hepatitis B Surface Ag: NEGATIVE

## 2017-02-10 ENCOUNTER — Other Ambulatory Visit: Payer: Self-pay

## 2017-02-10 ENCOUNTER — Emergency Department (HOSPITAL_COMMUNITY): Payer: Medicaid Other

## 2017-02-10 ENCOUNTER — Encounter (HOSPITAL_COMMUNITY): Payer: Self-pay | Admitting: Emergency Medicine

## 2017-02-10 ENCOUNTER — Emergency Department (HOSPITAL_COMMUNITY)
Admission: EM | Admit: 2017-02-10 | Discharge: 2017-02-10 | Disposition: A | Discharge status: Home / Self Care | Payer: Medicaid Other | Attending: Emergency Medicine | Admitting: Emergency Medicine

## 2017-02-10 DIAGNOSIS — R079 Chest pain, unspecified: Secondary | ICD-10-CM | POA: Diagnosis not present

## 2017-02-10 DIAGNOSIS — R51 Headache: Secondary | ICD-10-CM | POA: Diagnosis not present

## 2017-02-10 DIAGNOSIS — Z5321 Procedure and treatment not carried out due to patient leaving prior to being seen by health care provider: Secondary | ICD-10-CM | POA: Diagnosis not present

## 2017-02-10 LAB — CBC
HEMATOCRIT: 35.1 % — AB (ref 36.0–46.0)
Hemoglobin: 11.1 g/dL — ABNORMAL LOW (ref 12.0–15.0)
MCH: 21.5 pg — ABNORMAL LOW (ref 26.0–34.0)
MCHC: 31.6 g/dL (ref 30.0–36.0)
MCV: 68 fL — AB (ref 78.0–100.0)
PLATELETS: 252 10*3/uL (ref 150–400)
RBC: 5.16 MIL/uL — AB (ref 3.87–5.11)
RDW: 14.8 % (ref 11.5–15.5)
WBC: 8.1 10*3/uL (ref 4.0–10.5)

## 2017-02-10 LAB — BASIC METABOLIC PANEL
Anion gap: 9 (ref 5–15)
BUN: 6 mg/dL (ref 6–20)
CHLORIDE: 104 mmol/L (ref 101–111)
CO2: 22 mmol/L (ref 22–32)
Calcium: 9.1 mg/dL (ref 8.9–10.3)
Creatinine, Ser: 0.9 mg/dL (ref 0.44–1.00)
Glucose, Bld: 127 mg/dL — ABNORMAL HIGH (ref 65–99)
POTASSIUM: 3.4 mmol/L — AB (ref 3.5–5.1)
SODIUM: 135 mmol/L (ref 135–145)

## 2017-02-10 LAB — I-STAT TROPONIN, ED: Troponin i, poc: 0 ng/mL (ref 0.00–0.08)

## 2017-02-10 NOTE — ED Triage Notes (Signed)
Pt states she has had a migraine for the last 4 days that has not got any better, pt states 2 days ago she started having pain in her chest, denies any sob.

## 2017-02-10 NOTE — ED Notes (Signed)
Called patient for room, no answer 

## 2017-02-14 ENCOUNTER — Emergency Department (HOSPITAL_BASED_OUTPATIENT_CLINIC_OR_DEPARTMENT_OTHER): Payer: Medicaid Other

## 2017-02-14 ENCOUNTER — Emergency Department (HOSPITAL_BASED_OUTPATIENT_CLINIC_OR_DEPARTMENT_OTHER)
Admission: EM | Admit: 2017-02-14 | Discharge: 2017-02-14 | Disposition: A | Discharge status: Home / Self Care | Payer: Medicaid Other | Attending: Emergency Medicine | Admitting: Emergency Medicine

## 2017-02-14 ENCOUNTER — Encounter (HOSPITAL_BASED_OUTPATIENT_CLINIC_OR_DEPARTMENT_OTHER): Payer: Self-pay | Admitting: *Deleted

## 2017-02-14 ENCOUNTER — Other Ambulatory Visit: Payer: Self-pay

## 2017-02-14 DIAGNOSIS — R51 Headache: Secondary | ICD-10-CM | POA: Diagnosis present

## 2017-02-14 DIAGNOSIS — B349 Viral infection, unspecified: Secondary | ICD-10-CM

## 2017-02-14 DIAGNOSIS — N3000 Acute cystitis without hematuria: Secondary | ICD-10-CM

## 2017-02-14 DIAGNOSIS — Z79899 Other long term (current) drug therapy: Secondary | ICD-10-CM | POA: Insufficient documentation

## 2017-02-14 LAB — CBC WITH DIFFERENTIAL/PLATELET
BASOS ABS: 0 10*3/uL (ref 0.0–0.1)
BASOS PCT: 0 %
EOS PCT: 0 %
Eosinophils Absolute: 0 10*3/uL (ref 0.0–0.7)
HEMATOCRIT: 32.8 % — AB (ref 36.0–46.0)
Hemoglobin: 11 g/dL — ABNORMAL LOW (ref 12.0–15.0)
LYMPHS ABS: 1.6 10*3/uL (ref 0.7–4.0)
Lymphocytes Relative: 15 %
MCH: 22 pg — AB (ref 26.0–34.0)
MCHC: 33.5 g/dL (ref 30.0–36.0)
MCV: 65.7 fL — AB (ref 78.0–100.0)
MONOS PCT: 13 %
Monocytes Absolute: 1.4 10*3/uL — ABNORMAL HIGH (ref 0.1–1.0)
NEUTROS ABS: 7.6 10*3/uL (ref 1.7–7.7)
Neutrophils Relative %: 72 %
Platelets: 260 10*3/uL (ref 150–400)
RBC: 4.99 MIL/uL (ref 3.87–5.11)
RDW: 14.7 % (ref 11.5–15.5)
WBC: 10.6 10*3/uL — ABNORMAL HIGH (ref 4.0–10.5)

## 2017-02-14 LAB — BASIC METABOLIC PANEL
Anion gap: 10 (ref 5–15)
BUN: 7 mg/dL (ref 6–20)
CALCIUM: 9 mg/dL (ref 8.9–10.3)
CHLORIDE: 98 mmol/L — AB (ref 101–111)
CO2: 24 mmol/L (ref 22–32)
CREATININE: 0.95 mg/dL (ref 0.44–1.00)
GFR calc Af Amer: >60 mL/min (ref >60)
GFR calc non Af Amer: >60 mL/min (ref >60)
GLUCOSE: 120 mg/dL — AB (ref 65–99)
Potassium: 3.5 mmol/L (ref 3.5–5.1)
Sodium: 132 mmol/L — ABNORMAL LOW (ref 135–145)

## 2017-02-14 LAB — URINALYSIS, ROUTINE W REFLEX MICROSCOPIC
BILIRUBIN URINE: NEGATIVE
GLUCOSE, UA: NEGATIVE mg/dL
Ketones, ur: NEGATIVE mg/dL
Nitrite: NEGATIVE
Protein, ur: NEGATIVE mg/dL
SPECIFIC GRAVITY, URINE: 1.01 (ref 1.005–1.030)
pH: 6 (ref 5.0–8.0)

## 2017-02-14 LAB — HEPATIC FUNCTION PANEL
ALBUMIN: 4.1 g/dL (ref 3.5–5.0)
ALK PHOS: 58 U/L (ref 38–126)
ALT: 11 U/L — ABNORMAL LOW (ref 14–54)
AST: 20 U/L (ref 15–41)
Bilirubin, Direct: <0.1 mg/dL — ABNORMAL LOW (ref 0.1–0.5)
TOTAL PROTEIN: 8.5 g/dL — AB (ref 6.5–8.1)
Total Bilirubin: 0.3 mg/dL (ref 0.3–1.2)

## 2017-02-14 LAB — I-STAT CG4 LACTIC ACID, ED: Lactic Acid, Venous: 0.97 mmol/L (ref 0.5–1.9)

## 2017-02-14 LAB — URINALYSIS, MICROSCOPIC (REFLEX)

## 2017-02-14 LAB — PREGNANCY, URINE: PREG TEST UR: NEGATIVE

## 2017-02-14 LAB — LIPASE, BLOOD: LIPASE: 31 U/L (ref 11–51)

## 2017-02-14 MED ORDER — VANCOMYCIN HCL IN DEXTROSE 1-5 GM/200ML-% IV SOLN: 1000.0000 mg | Freq: Three times a day (TID) | INTRAVENOUS | Status: DC

## 2017-02-14 MED ORDER — VANCOMYCIN HCL IN DEXTROSE 1-5 GM/200ML-% IV SOLN
1000.0000 mg | Freq: Once | INTRAVENOUS | Status: AC
  Administered 2017-02-14: 1000 mg via INTRAVENOUS
  Filled 2017-02-14: qty 200

## 2017-02-14 MED ORDER — ACETAMINOPHEN 325 MG PO TABS: 650.0000 mg | ORAL_TABLET | Freq: Once | ORAL | Status: DC

## 2017-02-14 MED ORDER — SODIUM CHLORIDE 0.9 % IV BOLUS (SEPSIS): 1000.0000 mL | Freq: Once | INTRAVENOUS | Status: DC

## 2017-02-14 MED ORDER — CEFTRIAXONE SODIUM 1 G IJ SOLR: 1.0000 g | Freq: Once | INTRAMUSCULAR | Status: DC

## 2017-02-14 MED ORDER — NAPROXEN 500 MG PO TABS: 500.0000 mg | ORAL_TABLET | Freq: Two times a day (BID) | ORAL | 0 refills | Status: DC

## 2017-02-14 MED ORDER — PIPERACILLIN-TAZOBACTAM 3.375 G IVPB 30 MIN
3.3750 g | Freq: Once | INTRAVENOUS | Status: AC
  Administered 2017-02-14: 3.375 g via INTRAVENOUS
  Filled 2017-02-14 (×2): qty 50

## 2017-02-14 MED ORDER — SODIUM CHLORIDE 0.9 % IV BOLUS (SEPSIS)
1000.0000 mL | Freq: Once | INTRAVENOUS | Status: AC
  Administered 2017-02-14: 1000 mL via INTRAVENOUS

## 2017-02-14 MED ORDER — ONDANSETRON HCL 4 MG/2ML IJ SOLN
4.0000 mg | Freq: Once | INTRAMUSCULAR | Status: AC
  Administered 2017-02-14: 4 mg via INTRAVENOUS
  Filled 2017-02-14: qty 2

## 2017-02-14 MED ORDER — CEPHALEXIN 500 MG PO CAPS: 500.0000 mg | ORAL_CAPSULE | Freq: Four times a day (QID) | ORAL | 0 refills | Status: DC

## 2017-02-14 MED ORDER — ACETAMINOPHEN 325 MG PO TABS
650.0000 mg | ORAL_TABLET | Freq: Once | ORAL | Status: AC
  Administered 2017-02-14: 650 mg via ORAL
  Filled 2017-02-14: qty 2

## 2017-02-14 MED ORDER — PIPERACILLIN-TAZOBACTAM 3.375 G IVPB: 3.3750 g | Freq: Three times a day (TID) | INTRAVENOUS | Status: DC

## 2017-02-14 MED ORDER — SODIUM CHLORIDE 0.9 % IV SOLN: INTRAVENOUS | Status: DC

## 2017-02-14 MED ORDER — HYDROMORPHONE HCL 1 MG/ML IJ SOLN
1.0000 mg | Freq: Once | INTRAMUSCULAR | Status: AC
  Administered 2017-02-14: 1 mg via INTRAVENOUS
  Filled 2017-02-14: qty 1

## 2017-02-14 MED ORDER — PROMETHAZINE HCL 25 MG PO TABS: 25.0000 mg | ORAL_TABLET | Freq: Four times a day (QID) | ORAL | 1 refills | Status: DC | PRN

## 2017-02-14 NOTE — Discharge Instructions (Signed)
Workup suggestive of urinary tract infection. Urine culture pending. Take antibiotic as directed. Other symptoms seem to be more viral type infection base. Take the Phenergan for nausea. Take the Naprosyn for bodyaches or fever. Return for any new or worse symptoms. Urine culture and blood cultures pending. Work note provided.

## 2017-02-14 NOTE — Progress Notes (Signed)
Pharmacy Antibiotic Note  Meagan Hall is a 26 y.o. female admitted on 02/14/2017 with sepsis.  Pharmacy has been consulted for vancomycin and zosyn dosing. Renal function wnl.  Vancomycin trough goal 15-20  Plan: 1) Vancomycin 1g IV q8 2) Zosyn 3.375g IV q8 (4h infusion) 3) Follow renal function, cultures, LOT, level if needed  Height:  (170.2 cm) Weight: 200 lb (90.7 kg) IBW/kg (Calculated) : 61.6  Temp (24hrs), Avg:101.5 F (38.6 C), Min:100.5 F (38.1 C), Max:102.5 F (39.2 C)   Recent Labs Lab 02/10/17 1026 02/14/17 1623 02/14/17 1708  WBC 8.1 10.6*  --   CREATININE 0.90 0.95  --   LATICACIDVEN  --   --  0.97    Estimated Creatinine Clearance: 101.9 mL/min (by C-G formula based on SCr of 0.95 mg/dL).    No Known Allergies  Antimicrobials this admission: 9/24 Vancomycin >> 9/24 Zosyn >>  Dose adjustments this admission: n/a  Microbiology results: 9/24 blood cx >>  Thank you for allowing pharmacy to be a part of this patient's care.  Fredrik Rigger 02/14/2017 5:14 PM

## 2017-02-14 NOTE — ED Notes (Signed)
Patient is A & O x4.  She understood AVS instructions.  

## 2017-02-14 NOTE — ED Provider Notes (Signed)
[facility] DEPT [facility] Provider Note   CSN: [id number] Arrival date & time: 02/14/17  1609     History   Chief Complaint Chief Complaint  Patient presents with  . Headache    Pt drove herself to ED and family cannot drive    HPI Meagan Hall is a 26 y.o. female.  Patient presented with multiple complaints. She has seemed to focus on the headache for 5 days. The patient main complaint here was bodyaches frontal headache abdominal pain all over and fever feeling. Patient denied any vaginal discharge denied any dysuria. Denied any upper respiratory symptoms. Patient had multiple episodes of vomiting. The more severe symptoms all started today. Patient denies any diarrhea. Denies any blood in the urine. No sick contacts.      Past Medical History:  Diagnosis Date  . Anemia     Patient Active Problem List   Diagnosis Date Noted  . Supervision of other normal pregnancy 12/21/2014  . Pregnancy 12/20/2014    Past Surgical History:  Procedure Laterality Date  . UMBILICAL HERNIA REPAIR      OB History    Gravida Para Term Preterm AB Living   5 4 4     4    SAB TAB Ectopic Multiple Live Births         0 4       Home Medications    Prior to Admission medications   Medication Sig Start Date End Date Taking? Authorizing Provider  cephALEXin (KEFLEX) 500 MG capsule Take 1 capsule (500 mg total) by mouth 4 (four) times daily. 02/14/17   Fredia Sorrow, MD  clindamycin (CLEOCIN) 300 MG capsule Take 1 capsule (300 mg total) by mouth 3 (three) times daily. 10/27/16   Leftwich-Kirby, Kathie Dike, CNM  Multiple Vitamins-Minerals (MULTI ADULT GUMMIES PO) Take 1 capsule by mouth daily.    [provider]  naproxen (NAPROSYN) 500 MG tablet Take 1 tablet (500 mg total) by mouth 2 (two) times daily. 02/14/17   Fredia Sorrow, MD  promethazine (PHENERGAN) 25 MG tablet Take 0.5-1 tablets (12.5-25 mg total) by mouth every 6 (six) hours as needed. 10/27/16   Leftwich-Kirby, Kathie Dike,  CNM  promethazine (PHENERGAN) 25 MG tablet Take 1 tablet (25 mg total) by mouth every 6 (six) hours as needed for nausea or vomiting. 02/14/17   Fredia Sorrow, MD    Family History Family History  Problem Relation Age of Onset  . Hypertension Mother   . Asthma Daughter     Social History Social History  Substance Use Topics  . Smoking status: Never Smoker  . Smokeless tobacco: Never Used  . Alcohol use No     Allergies   Patient has no known allergies.   Review of Systems Review of Systems  Constitutional: Positive for fatigue and fever. Negative for chills.  HENT: Negative for congestion and sore throat.   Eyes: Negative for redness and visual disturbance.  Respiratory: Negative for shortness of breath.   Cardiovascular: Positive for chest pain.  Gastrointestinal: Positive for abdominal pain, nausea and vomiting. Negative for diarrhea.  Genitourinary: Negative for pelvic pain and vaginal discharge.  Musculoskeletal: Positive for back pain, myalgias and neck pain.  Skin: Negative for rash.  Neurological: Positive for headaches.  Hematological: Does not bruise/bleed easily.  Psychiatric/Behavioral: Negative for confusion.     Physical Exam Updated Vital Signs BP 115/64 (BP Location: Right Arm)   Pulse 82   Temp 98.3 F (36.8 C)   Resp 18  Ht 1.702 m (5' 7" )   Wt 90.7 kg (200 lb)   LMP 01/22/2017   SpO2 100%   BMI 31.32 kg/m   Physical Exam  Constitutional: She is oriented to person, place, and time. She appears well-developed and well-nourished. She appears distressed.  HENT:  Head: Normocephalic and atraumatic.  Mouth/Throat: No oropharyngeal exudate.  Mucous membranes dry.  Eyes: Pupils are equal, round, and reactive to light. Conjunctivae and EOM are normal.  Neck: Normal range of motion. Neck supple.  Cardiovascular: Normal rate, regular rhythm and normal heart sounds.   Pulmonary/Chest: Effort normal and breath sounds normal. No respiratory  distress.  Abdominal: Soft. Bowel sounds are normal. There is no tenderness. There is no guarding.  Musculoskeletal: Normal range of motion. She exhibits no edema.  Neurological: She is alert and oriented to person, place, and time. No cranial nerve deficit or sensory deficit. She exhibits normal muscle tone. Coordination normal.  Skin: Skin is warm. Capillary refill takes less than 2 seconds. No rash noted.  Nursing note and vitals reviewed.    ED Treatments / Results  Labs (all labs ordered are listed, but only abnormal results are displayed) Labs Reviewed  URINALYSIS, ROUTINE W REFLEX MICROSCOPIC - Abnormal; Notable for the following:       Result Value   APPearance CLOUDY (*)    Hgb urine dipstick SMALL (*)    Leukocytes, UA LARGE (*)    All other components within normal limits  CBC WITH DIFFERENTIAL/PLATELET - Abnormal; Notable for the following:    WBC 10.6 (*)    Hemoglobin 11.0 (*)    HCT 32.8 (*)    MCV 65.7 (*)    MCH 22.0 (*)    Monocytes Absolute 1.4 (*)    All other components within normal limits  BASIC METABOLIC PANEL - Abnormal; Notable for the following:    Sodium 132 (*)    Chloride 98 (*)    Glucose, Bld 120 (*)    All other components within normal limits  URINALYSIS, MICROSCOPIC (REFLEX) - Abnormal; Notable for the following:    Bacteria, UA MANY (*)    Squamous Epithelial / LPF 0-5 (*)    All other components within normal limits  HEPATIC FUNCTION PANEL - Abnormal; Notable for the following:    Total Protein 8.5 (*)    ALT 11 (*)    Bilirubin, Direct <0.1 (*)    All other components within normal limits  URINE CULTURE  CULTURE, BLOOD (ROUTINE X 2)  CULTURE, BLOOD (ROUTINE X 2)  PREGNANCY, URINE  LIPASE, BLOOD  I-STAT CG4 LACTIC ACID, ED    EKG  EKG Interpretation None       Radiology Dg Chest Port 1 View  Result Date: 02/14/2017 CLINICAL DATA:  Fever. EXAM: PORTABLE CHEST 1 VIEW COMPARISON:  02/10/2017 FINDINGS: Low volume film. The  cardiomediastinal silhouette is unremarkable. There is no evidence of focal airspace disease, pulmonary edema, suspicious pulmonary nodule/mass, pleural effusion, or pneumothorax. No acute bony abnormalities are identified. IMPRESSION: No active disease. Electronically Signed   By: Margarette Canada M.D.   On: 02/14/2017 17:21    Procedures Procedures (including critical care time)  Medications Ordered in ED Medications  0.9 %  sodium chloride infusion (not administered)  vancomycin (VANCOCIN) IVPB 1000 mg/200 mL premix (not administered)  piperacillin-tazobactam (ZOSYN) IVPB 3.375 g (not administered)  acetaminophen (TYLENOL) tablet 650 mg (650 mg Oral Given 02/14/17 1625)  sodium chloride 0.9 % bolus 1,000 mL (1,000 mLs Intravenous New  Bag/Given 02/14/17 1702)  HYDROmorphone (DILAUDID) injection 1 mg (1 mg Intravenous Given 02/14/17 1717)  ondansetron (ZOFRAN) injection 4 mg (4 mg Intravenous Given 02/14/17 1717)  sodium chloride 0.9 % bolus 1,000 mL (0 mLs Intravenous Stopped 02/14/17 1841)    And  sodium chloride 0.9 % bolus 1,000 mL (1,000 mLs Intravenous New Bag/Given 02/14/17 1842)  piperacillin-tazobactam (ZOSYN) IVPB 3.375 g (0 g Intravenous Stopped 02/14/17 1800)  vancomycin (VANCOCIN) IVPB 1000 mg/200 mL premix (0 mg Intravenous Stopped 02/14/17 1903)     Initial Impression / Assessment and Plan / ED Course  I have reviewed the triage vital signs and the nursing notes.  Pertinent labs & imaging results that were available during my care of the patient were reviewed by me and considered in my medical decision making (see chart for details).    Initially based on patient's tachycardia fever that went up to 102 patient met the pre-sepsis criteria. Sepsis orders were done. Patient's urinalysis is back early and showed too numerous to count white. Lactic acid is not elevated. Do not have a significant leukocytosis. Other labs normal other than sodium being slightly low. Patient given pain  medicine and antinausea medicine and received the Zosyn and vancomycin. Patient with complete resolution of symptoms. Everything went away headache bodyaches Donald pain chest pain back pain neck pain. Patient eating a large bowl of pasta. Which she did not ask permission to have.  Clinically was a little concerned about possibly PID patient says she's got no vaginal pain does not want a pelvic. Was also clinically concerned about possible meningitis. But with the resolution of all the symptoms doubt that that is the case. Patient did have frontal headache but that really have a lot of neck stiffness or anything. Disorder had body aches everywhere. Vomiting completely resolved. I thought about maybe doing a CT scan for the abdominal pain but is now all resolved.  Urinalysis does suggest urinary tract infection. I'll see blood cultures and urine culture pending. Will treat for UTI. I do not think this is pyelonephritis, has no CVA tenderness.   Symptoms may very well be a viral related. Will be continued on Keflex for the urinary tract infection. Will begin Phenergan for the nausea and vomiting if it reoccurs. Naprosyn for the bodyaches. Fever. Also fever resolved completely. Will give patient work note to be out of work and will have her return for any new or worse symptoms.  Patient's turnaround was very remarkable and is completely asymptomatic at the moment.  Final Clinical Impressions(s) / ED Diagnoses   Final diagnoses:  Viral illness  Acute cystitis without hematuria    New Prescriptions New Prescriptions   CEPHALEXIN (KEFLEX) 500 MG CAPSULE    Take 1 capsule (500 mg total) by mouth 4 (four) times daily.   NAPROXEN (NAPROSYN) 500 MG TABLET    Take 1 tablet (500 mg total) by mouth 2 (two) times daily.   PROMETHAZINE (PHENERGAN) 25 MG TABLET    Take 1 tablet (25 mg total) by mouth every 6 (six) hours as needed for nausea or vomiting.     Fredia Sorrow, MD 02/14/17 [phone]

## 2017-02-14 NOTE — ED Triage Notes (Signed)
Headache x 5 days. States her stomach feels raw. Unable to eat or drink. Neck and back pain. States she went to [facility] for same yesterday but left after the wait was too long.

## 2017-02-16 LAB — URINE CULTURE

## 2017-02-17 ENCOUNTER — Telehealth: Payer: Self-pay | Admitting: *Deleted

## 2017-02-17 NOTE — Telephone Encounter (Signed)
Post ED Visit - Positive Culture Follow-up  Culture report reviewed by antimicrobial stewardship pharmacist:   Enzo Bi, Pharm.D.  Celedonio Miyamoto, Pharm.D., BCPS AQ-ID  Garvin Fila, Pharm.D., BCPS  Georgina Pillion, [address]D., BCPS  [place], [address]D., BCPS, AAHIVP  Estella Husk, Pharm.D., BCPS, AAHIVP  Lysle Pearl, PharmD, BCPS  Casilda Carls, PharmD, BCPS  Pollyann Samples, PharmD, BCPS  Positive urine culture Treated with Cephalexin, organism sensitive to the same and no further patient follow-up is required at this time.  [facility] 02/17/2017, 12:37 PM

## 2017-02-19 LAB — CULTURE, BLOOD (ROUTINE X 2)
CULTURE: NO GROWTH
Culture: NO GROWTH
SPECIAL REQUESTS: ADEQUATE
Special Requests: ADEQUATE

## 2017-04-19 ENCOUNTER — Inpatient Hospital Stay (HOSPITAL_COMMUNITY)
Admission: AD | Admit: 2017-04-19 | Discharge: 2017-04-19 | Disposition: A | Discharge status: Home / Self Care | Payer: Medicaid Other | Source: Ambulatory Visit | Attending: Obstetrics & Gynecology | Admitting: Obstetrics & Gynecology

## 2017-04-19 ENCOUNTER — Other Ambulatory Visit: Payer: Self-pay

## 2017-04-19 ENCOUNTER — Encounter (HOSPITAL_COMMUNITY): Payer: Self-pay | Admitting: *Deleted

## 2017-04-19 DIAGNOSIS — O99611 Diseases of the digestive system complicating pregnancy, first trimester: Secondary | ICD-10-CM | POA: Insufficient documentation

## 2017-04-19 DIAGNOSIS — O219 Vomiting of pregnancy, unspecified: Secondary | ICD-10-CM

## 2017-04-19 DIAGNOSIS — K5900 Constipation, unspecified: Secondary | ICD-10-CM | POA: Diagnosis not present

## 2017-04-19 DIAGNOSIS — Z3A09 9 weeks gestation of pregnancy: Secondary | ICD-10-CM | POA: Diagnosis not present

## 2017-04-19 DIAGNOSIS — Z3201 Encounter for pregnancy test, result positive: Secondary | ICD-10-CM

## 2017-04-19 LAB — URINALYSIS, ROUTINE W REFLEX MICROSCOPIC
BILIRUBIN URINE: NEGATIVE
Glucose, UA: NEGATIVE mg/dL
HGB URINE DIPSTICK: NEGATIVE
Ketones, ur: NEGATIVE mg/dL
Leukocytes, UA: NEGATIVE
Nitrite: NEGATIVE
PH: 6 (ref 5.0–8.0)
Protein, ur: NEGATIVE mg/dL
SPECIFIC GRAVITY, URINE: 1.02 (ref 1.005–1.030)

## 2017-04-19 LAB — POCT PREGNANCY, URINE: Preg Test, Ur: POSITIVE — AB

## 2017-04-19 MED ORDER — VITAMIN B-6 50 MG PO TABS: 50.0000 mg | ORAL_TABLET | Freq: Every day | ORAL | 0 refills | Status: DC

## 2017-04-19 MED ORDER — DOXYLAMINE SUCCINATE (SLEEP) 25 MG PO TABS: 25.0000 mg | ORAL_TABLET | Freq: Every day | ORAL | 0 refills | Status: DC

## 2017-04-19 MED ORDER — POLYETHYLENE GLYCOL 3350 17 GM/SCOOP PO POWD: 1.0000 | Freq: Every day | ORAL | 0 refills | Status: DC

## 2017-04-19 NOTE — MAU Provider Note (Signed)
  History     CSN: [id number]  Arrival date and time: 04/19/17 0934   None     Chief Complaint  Patient presents with  . Abdominal Pain  . Emesis  . Vaginal Pain   26 yo [id number] at 5562w0d gestation based on LMP presents with vomiting and nausea. Admits to constipation x 2 weeks. She is passing gas. Admits to sharp pain in her abdomen, mostly around umbilicus. Denies lower abdominal cramping or vaginal bleeding. Has not established care for this pregnancy. Has tried juice and coffee for constipation.      OB History    Gravida Para Term Preterm AB Living   6 4 4   1 4    SAB TAB Ectopic Multiple Live Births     1   0 4      Past Medical History:  Diagnosis Date  . Anemia     Past Surgical History:  Procedure Laterality Date  . UMBILICAL HERNIA REPAIR      Family History  Problem Relation Age of Onset  . Hypertension Mother   . Asthma Daughter     Social History   Tobacco Use  . Smoking status: Never Smoker  . Smokeless tobacco: Never Used  Substance Use Topics  . Alcohol use: No  . Drug use: No    Allergies: No Known Allergies  No medications prior to admission.    Review of Systems  Constitutional: Negative for fatigue and fever.  HENT: Negative for congestion and ear pain.   Eyes: Negative for discharge and itching.  Respiratory: Negative for apnea and chest tightness.   Cardiovascular: Negative for chest pain and leg swelling.  Gastrointestinal: Positive for abdominal pain, constipation, nausea and vomiting.  Endocrine: Negative for cold intolerance and heat intolerance.  Genitourinary: Negative for vaginal bleeding and vaginal pain.  Musculoskeletal: Negative for arthralgias and back pain.  Neurological: Negative for dizziness and light-headedness.  Hematological: Negative for adenopathy. Does not bruise/bleed easily.   Physical Exam   Blood pressure 125/66, pulse 85, temperature 98.5 F (36.9 C), temperature source Oral, resp. rate 16,  weight 211 lb (95.7 kg), last menstrual period 02/15/2017, SpO2 100 %, unknown if currently breastfeeding.  Physical Exam  Constitutional: She is oriented to person, place, and time. She appears well-developed and well-nourished.  HENT:  Head: Normocephalic and atraumatic.  Eyes: Conjunctivae are normal. Pupils are equal, round, and reactive to light.  Neck: Normal range of motion. Neck supple.  Cardiovascular: Normal rate and intact distal pulses.  Respiratory: Effort normal. No respiratory distress.  GI: Soft. She exhibits no distension and no mass. There is no tenderness. There is no rebound and no guarding.  Musculoskeletal: Normal range of motion. She exhibits no edema.  Neurological: She is alert and oriented to person, place, and time.  Skin: Skin is warm and dry.  Psychiatric: She has a normal mood and affect.    MAU Course  Procedures  MDM Benign abdominal exam but with history, will treat as outpatient for constipation and vomiting.  Assessment and Plan  1. Pregnancy at 9 weeks completed gestation- follow up outpatient to establish care 2. Nausea and vomiting in pregnancy- vit b6 and doxylamine 3. Constipation- miralax cleanout  Elesha Thedford 04/19/2017, 10:28 AM

## 2017-04-19 NOTE — Discharge Instructions (Signed)
Constipation, Adult °Constipation is when a person: °· Poops (has a bowel movement) fewer times in a week than normal. °· Has a hard time pooping. °· Has poop that is dry, hard, or bigger than normal. ° °Follow these instructions at home: °Eating and drinking ° °· Eat foods that have a lot of fiber, such as: °? Fresh fruits and vegetables. °? Whole grains. °? Beans. °· Eat less of foods that are high in fat, low in fiber, or overly processed, such as: °? French fries. °? Hamburgers. °? Cookies. °? Candy. °? Soda. °· Drink enough fluid to keep your pee (urine) clear or pale yellow. °General instructions °· Exercise regularly or as told by your doctor. °· Go to the restroom when you feel like you need to poop. Do not hold it in. °· Take over-the-counter and prescription medicines only as told by your doctor. These include any fiber supplements. °· Do pelvic floor retraining exercises, such as: °? Doing deep breathing while relaxing your lower belly (abdomen). °? Relaxing your pelvic floor while pooping. °· Watch your condition for any changes. °· Keep all follow-up visits as told by your doctor. This is important. °Contact a doctor if: °· You have pain that gets worse. °· You have a fever. °· You have not pooped for 4 days. °· You throw up (vomit). °· You are not hungry. °· You lose weight. °· You are bleeding from the anus. °· You have thin, pencil-like poop (stool). °Get help right away if: °· You have a fever, and your symptoms suddenly get worse. °· You leak poop or have blood in your poop. °· Your belly feels hard or bigger than normal (is bloated). °· You have very bad belly pain. °· You feel dizzy or you faint. °This information is not intended to replace advice given to you by your health care provider. Make sure you discuss any questions you have with your health care provider. °Document Released: 10/27/2007 Document Revised: 11/28/2015 Document Reviewed: 10/29/2015 °Elsevier Interactive Patient Education ©  2017 [employer] Inc[employer] ° °

## 2017-04-19 NOTE — MAU Note (Signed)
Ongoing vomiting.  Every time she throws up, there is pain in her upper abd up into chest, burning.  Also having stabbing pains in her stomach and vagina.  Had an abortion with last preg, wants to make sure everything is ok.  majorly constipated

## 2017-05-24 NOTE — L&D Delivery Note (Signed)
Delivery Note Patient progressed to 10/10/0.  She pushed with two contractions.  At 9:54 AM a viable female was delivered via  (Presentation:LOA  ).  APGAR: 8, 9; weight pending .   Placenta status: Spontaneous, in tact .  Cord:3V  with the following complications: None.  Cord pH: n/a  Immediately following delivery of the placenta, uterus was firm with no atony.  Blood loss was minimal.  Uterus continued to remain firm following delivery with additional fundal checks.   Anesthesia:  Epidural Episiotomy:  None Lacerations:  None Suture Repair: none Est. Blood Loss (mL):  100 mL  Mom to postpartum.  Baby to Couplet care / Skin to Skin.  Brad Lieurance GEFFEL Jiovanny Burdell 11/05/2017, 10:12 AM

## 2017-09-30 ENCOUNTER — Inpatient Hospital Stay (HOSPITAL_COMMUNITY): Payer: Medicaid Other

## 2017-09-30 ENCOUNTER — Inpatient Hospital Stay (HOSPITAL_COMMUNITY)
Admission: AD | Admit: 2017-09-30 | Discharge: 2017-10-01 | Disposition: A | Discharge status: Home / Self Care | Payer: Medicaid Other | Source: Ambulatory Visit | Attending: Obstetrics and Gynecology | Admitting: Obstetrics and Gynecology

## 2017-09-30 ENCOUNTER — Encounter (HOSPITAL_COMMUNITY): Payer: Self-pay | Admitting: *Deleted

## 2017-09-30 DIAGNOSIS — Z3A32 32 weeks gestation of pregnancy: Secondary | ICD-10-CM | POA: Insufficient documentation

## 2017-09-30 DIAGNOSIS — O368130 Decreased fetal movements, third trimester, not applicable or unspecified: Secondary | ICD-10-CM | POA: Diagnosis not present

## 2017-09-30 DIAGNOSIS — Z3689 Encounter for other specified antenatal screening: Secondary | ICD-10-CM

## 2017-09-30 DIAGNOSIS — W19XXXA Unspecified fall, initial encounter: Secondary | ICD-10-CM

## 2017-09-30 LAB — URINALYSIS, ROUTINE W REFLEX MICROSCOPIC
BILIRUBIN URINE: NEGATIVE
Bacteria, UA: NONE SEEN
Glucose, UA: NEGATIVE mg/dL
Ketones, ur: NEGATIVE mg/dL
Nitrite: NEGATIVE
Protein, ur: NEGATIVE mg/dL
SPECIFIC GRAVITY, URINE: 1.008 (ref 1.005–1.030)
pH: 7 (ref 5.0–8.0)

## 2017-09-30 NOTE — MAU Note (Signed)
Was walking about 1800 and slipped and fell. Fell back onto back and L side. Did not hit abdomen. No vag bleeding or LOF. Baby usually very active and have not felt FM. Some abd cramping wrapping around to lower back

## 2017-09-30 NOTE — Progress Notes (Signed)
To us

## 2017-09-30 NOTE — Progress Notes (Signed)
Baby moving and pt aware. Pt sat up at 2302 and maternal HR traced

## 2017-10-01 DIAGNOSIS — O368130 Decreased fetal movements, third trimester, not applicable or unspecified: Secondary | ICD-10-CM | POA: Diagnosis not present

## 2017-10-01 NOTE — Progress Notes (Addendum)
Dr Doroteo Glassman in earlier to discuss d/c plan. Written and verbal d/c instructions given and understanding voiced. Fetal kick count discussed and pt voiced understanding

## 2017-10-01 NOTE — MAU Provider Note (Signed)
Chief Complaint:  Fall   First Provider Initiated Contact with Patient 09/30/17 2318      HPI: Meagan Hall is a 27 y.o. [id number] at [redacted]w[redacted]d who presents to MAU reporting fall around 6 PM.  Patient states she was at a park when she slipped on something wet.  She fell on her back and left side.  Denies any abdominal trauma.  Patient states that she finally decided to come in now because she had decreased fetal movement.  States she had not felt the baby move since she fell.  Had some mild cramping status post fall. Denies contractions, leakage of fluid, vaginal discharge, or vaginal bleeding. Good fetal movement since being in the MAU.   Pregnancy Course:   Past Medical History: Past Medical History:  Diagnosis Date  . Anemia     Past obstetric history: OB History  Gravida Para Term Preterm AB Living  SAB TAB Ectopic Multiple Live Births    1   0 4    # Outcome Date GA Lbr Len/2nd Weight Sex Delivery Anes PTL Lv  6 Current           5 Term 12/21/14 [redacted]w[redacted]d / 00:42 3.915 kg (8 lb 10.1 oz) M Vag-Spont EPI  LIV     Birth Comments: Hgb, normal, FA Newborn Screen Barcode: [id number] Date Collected: 12/22/2014  4 Term 02/13/10    F    LIV  3 Term 07/30/07    M    LIV  2 Term 08/17/06    M    LIV  1 TAB             Past Surgical History: Past Surgical History:  Procedure Laterality Date  . UMBILICAL HERNIA REPAIR       Family History: Family History  Problem Relation Age of Onset  . Hypertension Mother   . Asthma Daughter     Social History: Social History   Tobacco Use  . Smoking status: Never Smoker  . Smokeless tobacco: Never Used  Substance Use Topics  . Alcohol use: No  . Drug use: No    Allergies: No Known Allergies  Meds:  Medications Prior to Admission  Medication Sig Dispense Refill Last Dose  . cephALEXin (KEFLEX) 500 MG capsule Take 1 capsule (500 mg total) by mouth 4 (four) times daily. 28 capsule 0 09/30/2017 at Unknown time  . Multiple  Vitamins-Minerals (MULTI ADULT GUMMIES PO) Take 1 capsule by mouth daily.   09/30/2017 at Unknown time  . clindamycin (CLEOCIN) 300 MG capsule Take 1 capsule (300 mg total) by mouth 3 (three) times daily. 21 capsule 0   . doxylamine, Sleep, (UNISOM) 25 MG tablet Take 1 tablet (25 mg total) by mouth at bedtime. 30 tablet 0   . polyethylene glycol powder (MIRALAX) powder Take 255 g by mouth daily. 1700 g 0   . promethazine (PHENERGAN) 25 MG tablet Take 0.5-1 tablets (12.5-25 mg total) by mouth every 6 (six) hours as needed. 30 tablet 2   . promethazine (PHENERGAN) 25 MG tablet Take 1 tablet (25 mg total) by mouth every 6 (six) hours as needed for nausea or vomiting. 10 tablet 1   . pyridOXINE (VITAMIN B-6) 50 MG tablet Take 1 tablet (50 mg total) by mouth daily. 30 tablet 0     I have reviewed patient's Past Medical Hx, Surgical Hx, Family Hx, Social Hx, medications and allergies.   ROS:  All systems reviewed  and are negative for acute change except as noted in the HPI.   Physical Exam   Patient Vitals for the past 24 hrs:  BP Temp Pulse Resp Height Weight  10/01/17 0021 112/71 - 91 16 - -  09/30/17 2225 125/67 98.5 F (36.9 C) 96 18  (1.702 m) 108.4 kg (239 lb)   Constitutional: Well-developed, well-nourished female in no acute distress.  Cardiovascular: normal rate and rhythm, pulses intact Respiratory: normal rate and effort.  GI: Abd soft, non-tender, gravid appropriate for gestational age.  MS: Extremities nontender, no edema, normal ROM Neurologic: Alert and oriented x 4.  GU: Neg CVAT. Pelvic: deferred Psych: normal mood and affect Skin: warm and dry. No bruising appreciated.     Labs: Results for orders placed or performed during the hospital encounter of 09/30/17 (from the past 24 hour(s))  Urinalysis, Routine w reflex microscopic     Status: Abnormal   Collection Time: 09/30/17 10:22 PM  Result Value Ref Range   Color, Urine YELLOW YELLOW   APPearance CLEAR CLEAR    Specific Gravity, Urine 1.008 1.005 - 1.030   pH 7.0 5.0 - 8.0   Glucose, UA NEGATIVE NEGATIVE mg/dL   Hgb urine dipstick LARGE (A) NEGATIVE   Bilirubin Urine NEGATIVE NEGATIVE   Ketones, ur NEGATIVE NEGATIVE mg/dL   Protein, ur NEGATIVE NEGATIVE mg/dL   Nitrite NEGATIVE NEGATIVE   Leukocytes, UA TRACE (A) NEGATIVE   RBC / HPF 6-10 0 - 5 RBC/hpf   WBC, UA 6-10 0 - 5 WBC/hpf   Bacteria, UA NONE SEEN NONE SEEN   Squamous Epithelial / LPF 0-5 0 - 5    Imaging:  No results found.  MAU Management/MDM: Vitals and nursing notes reviewed Orders Placed This Encounter  Procedures  . [place] MFM OB LIMITED  . Urinalysis, Routine w reflex microscopic    No orders of the defined types were placed in this encounter.  Consult Dr. Dareen Piano who recommended against prolonged monitoring at this time. Await [place] results.   Limited OB US without signs of abruption.   Plan of care reviewed with patient, including labs and tests ordered and medical treatment.  I personally reviewed the patient's NST today, found to be REACTIVE. 125 bpm, mod var, +accels, no decels. CTX: None.  ASSESSMENT 1. Decreased fetal movements in third trimester, single or unspecified fetus   2. Fall   3. NST (non-stress test) reactive     PLAN Discharge home in stable condition. Pt discharged with strict return precautions. Handout given Follow-up with OB provider    Allergies as of 10/01/2017   No Known Allergies     Medication List    ASK your doctor about these medications   cephALEXin 500 MG capsule Commonly known as:  KEFLEX Take 1 capsule (500 mg total) by mouth 4 (four) times daily.   clindamycin 300 MG capsule Commonly known as:  CLEOCIN Take 1 capsule (300 mg total) by mouth 3 (three) times daily.   doxylamine (Sleep) 25 MG tablet Commonly known as:  UNISOM Take 1 tablet (25 mg total) by mouth at bedtime.   MULTI ADULT GUMMIES PO Take 1 capsule by mouth daily.   polyethylene glycol powder  powder Commonly known as:  MIRALAX Take 255 g by mouth daily.   promethazine 25 MG tablet Commonly known as:  PHENERGAN Take 0.5-1 tablets (12.5-25 mg total) by mouth every 6 (six) hours as needed.   promethazine 25 MG tablet Commonly known as:  PHENERGAN Take  1 tablet (25 mg total) by mouth every 6 (six) hours as needed for nausea or vomiting.   pyridOXINE 50 MG tablet Commonly known as:  VITAMIN B-6 Take 1 tablet (50 mg total) by mouth daily.       Caryl Ada, DO [facility]  10/01/2017, 12:26 AM

## 2017-10-01 NOTE — Progress Notes (Signed)
EFM reapplied after returning from u/s

## 2017-10-01 NOTE — Discharge Instructions (Signed)

## 2017-10-08 ENCOUNTER — Inpatient Hospital Stay (HOSPITAL_COMMUNITY)
Admission: AD | Admit: 2017-10-08 | Discharge: 2017-10-09 | Disposition: A | Discharge status: Home / Self Care | Payer: Medicaid Other | Source: Ambulatory Visit | Attending: Obstetrics | Admitting: Obstetrics

## 2017-10-08 DIAGNOSIS — O4703 False labor before 37 completed weeks of gestation, third trimester: Secondary | ICD-10-CM | POA: Insufficient documentation

## 2017-10-08 DIAGNOSIS — Z0371 Encounter for suspected problem with amniotic cavity and membrane ruled out: Secondary | ICD-10-CM

## 2017-10-08 DIAGNOSIS — O26893 Other specified pregnancy related conditions, third trimester: Secondary | ICD-10-CM | POA: Insufficient documentation

## 2017-10-08 DIAGNOSIS — O99013 Anemia complicating pregnancy, third trimester: Secondary | ICD-10-CM | POA: Insufficient documentation

## 2017-10-08 DIAGNOSIS — Z8249 Family history of ischemic heart disease and other diseases of the circulatory system: Secondary | ICD-10-CM | POA: Insufficient documentation

## 2017-10-08 DIAGNOSIS — R110 Nausea: Secondary | ICD-10-CM | POA: Insufficient documentation

## 2017-10-08 DIAGNOSIS — Z79899 Other long term (current) drug therapy: Secondary | ICD-10-CM | POA: Insufficient documentation

## 2017-10-08 DIAGNOSIS — O479 False labor, unspecified: Secondary | ICD-10-CM

## 2017-10-08 DIAGNOSIS — Z3A33 33 weeks gestation of pregnancy: Secondary | ICD-10-CM

## 2017-10-09 ENCOUNTER — Encounter (HOSPITAL_COMMUNITY): Payer: Self-pay | Admitting: *Deleted

## 2017-10-09 DIAGNOSIS — Z79899 Other long term (current) drug therapy: Secondary | ICD-10-CM | POA: Diagnosis not present

## 2017-10-09 DIAGNOSIS — Z8249 Family history of ischemic heart disease and other diseases of the circulatory system: Secondary | ICD-10-CM | POA: Diagnosis not present

## 2017-10-09 DIAGNOSIS — O99013 Anemia complicating pregnancy, third trimester: Secondary | ICD-10-CM | POA: Diagnosis not present

## 2017-10-09 DIAGNOSIS — O4703 False labor before 37 completed weeks of gestation, third trimester: Secondary | ICD-10-CM | POA: Diagnosis not present

## 2017-10-09 DIAGNOSIS — R110 Nausea: Secondary | ICD-10-CM | POA: Diagnosis not present

## 2017-10-09 DIAGNOSIS — N898 Other specified noninflammatory disorders of vagina: Secondary | ICD-10-CM | POA: Diagnosis present

## 2017-10-09 DIAGNOSIS — R109 Unspecified abdominal pain: Secondary | ICD-10-CM | POA: Diagnosis present

## 2017-10-09 DIAGNOSIS — Z3A33 33 weeks gestation of pregnancy: Secondary | ICD-10-CM | POA: Diagnosis not present

## 2017-10-09 DIAGNOSIS — O26893 Other specified pregnancy related conditions, third trimester: Secondary | ICD-10-CM | POA: Diagnosis present

## 2017-10-09 LAB — POCT FERN TEST: POCT Fern Test: NEGATIVE

## 2017-10-09 NOTE — MAU Provider Note (Signed)
Chief Complaint:  Contractions   First Provider Initiated Contact with Patient 10/09/17 0014      HPI: Meagan Hall is a 27 y.o. [id number] at 19w5dwho presents to maternity admissions reporting contractions and rupture of membranes. She reports contractions started one hour prior to arriving to MAU, contractions occur every 30 minutes per patient. She rates contractions 5/10 when she has an contraction. She reports having a "trickle" of fluid down her leg one hour ago, she reports having another trickle of fluid when she got into her car to come here. She denies a gush of fluid or vaginal bleeding. She reports good fetal movement, she reports being nauseous with contractions, has Zofran at home but has not taken any today.    Past Medical History: Past Medical History:  Diagnosis Date  . Anemia     Past obstetric history: OB History  Gravida Para Term Preterm AB Living  SAB TAB Ectopic Multiple Live Births    1   0 4    # Outcome Date GA Lbr Len/2nd Weight Sex Delivery Anes PTL Lv  6 Current           5 Term 12/21/14 [redacted]w[redacted]d / 00:42 8 lb 10.1 oz (3.915 kg) M Vag-Spont EPI  LIV     Birth Comments: Hgb, normal, FA Newborn Screen Barcode: [id number] Date Collected: 12/22/2014  4 Term 02/13/10    F    LIV  3 Term 07/30/07    M    LIV  2 Term 08/17/06    M    LIV  1 TAB             Past Surgical History: Past Surgical History:  Procedure Laterality Date  . UMBILICAL HERNIA REPAIR      Family History: Family History  Problem Relation Age of Onset  . Hypertension Mother   . Asthma Daughter     Social History: Social History   Tobacco Use  . Smoking status: Never Smoker  . Smokeless tobacco: Never Used  Substance Use Topics  . Alcohol use: No  . Drug use: No    Allergies: No Known Allergies  Meds:  Medications Prior to Admission  Medication Sig Dispense Refill Last Dose  . cephALEXin (KEFLEX) 500 MG capsule Take 1 capsule (500 mg total) by mouth 4  (four) times daily. 28 capsule 0 09/30/2017 at Unknown time  . clindamycin (CLEOCIN) 300 MG capsule Take 1 capsule (300 mg total) by mouth 3 (three) times daily. 21 capsule 0   . doxylamine, Sleep, (UNISOM) 25 MG tablet Take 1 tablet (25 mg total) by mouth at bedtime. 30 tablet 0   . Multiple Vitamins-Minerals (MULTI ADULT GUMMIES PO) Take 1 capsule by mouth daily.   09/30/2017 at Unknown time  . polyethylene glycol powder (MIRALAX) powder Take 255 g by mouth daily. 1700 g 0   . promethazine (PHENERGAN) 25 MG tablet Take 0.5-1 tablets (12.5-25 mg total) by mouth every 6 (six) hours as needed. 30 tablet 2   . promethazine (PHENERGAN) 25 MG tablet Take 1 tablet (25 mg total) by mouth every 6 (six) hours as needed for nausea or vomiting. 10 tablet 1   . pyridOXINE (VITAMIN B-6) 50 MG tablet Take 1 tablet (50 mg total) by mouth daily. 30 tablet 0     ROS:  Review of Systems  Respiratory: Negative.   Cardiovascular: Negative.   Gastrointestinal: Positive for abdominal pain and nausea. Negative for  constipation, diarrhea and vomiting.       Contractions   Genitourinary: Positive for vaginal discharge. Negative for decreased urine volume, difficulty urinating, dysuria, frequency, pelvic pain and vaginal bleeding.       Possible leaking of fluid    I have reviewed patient's Past Medical Hx, Surgical Hx, Family Hx, Social Hx, medications and allergies.   Physical Exam   Patient Vitals for the past 24 hrs:  BP Temp Pulse Resp  10/09/17 0053 123/71 - - (!) 88  10/09/17 0009 133/79 98.1 F (36.7 C) 98 18   Constitutional: Well-developed, well-nourished female in no acute distress.  Cardiovascular: normal rate Respiratory: normal effort GI: Abd soft, non-tender, gravid appropriate for gestational age.  MS: Extremities nontender, no edema, normal ROM Neurologic: Alert and oriented x 4.   PELVIC EXAM: Cervix pink, visually open slightly, without lesion, moderate white creamy discharge, vaginal  walls and external genitalia normal, Negative pooling  CERVICAL EXAM:  Dilation: Fingertip Effacement (%): 70 Cervical Position: Posterior Station: -2 Presentation: Vertex Exam by:: K.WIlson,RN  FHT:  Baseline 135 , moderate variability, accelerations present, no decelerations Contractions: none   Labs: Results for orders placed or performed during the hospital encounter of 10/08/17 (from the past 24 hour(s))  POCT fern test     Status: None   Collection Time: 10/09/17 12:40 AM  Result Value Ref Range   POCT Fern Test Negative = intact amniotic membranes     MAU Course/MDM: Orders Placed This Encounter  Procedures  . POCT fern test  . Discharge patient Discharge disposition: 01-Home or Self Care; Discharge patient date: 10/09/2017   Crist Fat- Negative  Pooling- Negative   NST reviewed- reactive  Consult Dr Chestine Spore with presentation, exam findings and test results. Okay to discharge home with follow up in the office as scheduled.     Pt discharge with strict PTL precautions. Educated on reasons to return to MAU ASAP. Patient verbalizes understanding.   Today's evaluation included a work-up for preterm labor which can be life-threatening for both mom and baby.  Assessment: 1. No leakage of amniotic fluid into vagina   2. [redacted] weeks gestation of pregnancy   3. Braxton Hick's contraction     Plan: Discharge home Preterm Labor precautions and fetal kick counts Follow up as scheduled on Tuesday for prenatal appointment  Return to MAU with contractions that occur regularly and/or rupture of membranes   Follow-up Information    Ob/Gyn, [facility] Follow up.   Why:  Follow up as scheduled on Tuesday for prenatal appointment Contact information: [address] [place] [address] [phone]           Allergies as of 10/09/2017   No Known Allergies     Medication List    STOP taking these medications   cephALEXin 500 MG capsule Commonly known as:   KEFLEX   clindamycin 300 MG capsule Commonly known as:  CLEOCIN     TAKE these medications   doxylamine (Sleep) 25 MG tablet Commonly known as:  UNISOM Take 1 tablet (25 mg total) by mouth at bedtime.   MULTI ADULT GUMMIES PO Take 1 capsule by mouth daily.   polyethylene glycol powder powder Commonly known as:  MIRALAX Take 255 g by mouth daily.   promethazine 25 MG tablet Commonly known as:  PHENERGAN Take 0.5-1 tablets (12.5-25 mg total) by mouth every 6 (six) hours as needed.   promethazine 25 MG tablet Commonly known as:  PHENERGAN Take 1  tablet (25 mg total) by mouth every 6 (six) hours as needed for nausea or vomiting.   pyridOXINE 50 MG tablet Commonly known as:  VITAMIN B-6 Take 1 tablet (50 mg total) by mouth daily.      Steward Drone Certified Nurse-Midwife 10/09/2017 1:47 AM

## 2017-10-09 NOTE — MAU Note (Signed)
Pt stated she started having ctx about 1 hr ago with increased pelvic pressure. Reports some leaking down her leg. Reports she has been vomiting since the ctx started as well.

## 2017-11-04 ENCOUNTER — Encounter (HOSPITAL_COMMUNITY): Payer: Self-pay | Admitting: *Deleted

## 2017-11-04 ENCOUNTER — Inpatient Hospital Stay (HOSPITAL_COMMUNITY)
Admission: AD | Admit: 2017-11-04 | Discharge: 2017-11-07 | DRG: 805 | Disposition: A | Discharge status: Home / Self Care | Payer: Medicaid Other | Attending: Obstetrics | Admitting: Obstetrics

## 2017-11-04 DIAGNOSIS — O2662 Liver and biliary tract disorders in childbirth: Principal | ICD-10-CM | POA: Diagnosis present

## 2017-11-04 DIAGNOSIS — Z3A37 37 weeks gestation of pregnancy: Secondary | ICD-10-CM

## 2017-11-04 DIAGNOSIS — O9902 Anemia complicating childbirth: Secondary | ICD-10-CM | POA: Diagnosis present

## 2017-11-04 DIAGNOSIS — D649 Anemia, unspecified: Secondary | ICD-10-CM | POA: Diagnosis present

## 2017-11-04 DIAGNOSIS — O99824 Streptococcus B carrier state complicating childbirth: Secondary | ICD-10-CM | POA: Diagnosis present

## 2017-11-04 DIAGNOSIS — K831 Obstruction of bile duct: Secondary | ICD-10-CM | POA: Diagnosis present

## 2017-11-04 DIAGNOSIS — O99214 Obesity complicating childbirth: Secondary | ICD-10-CM | POA: Diagnosis present

## 2017-11-04 DIAGNOSIS — O26613 Liver and biliary tract disorders in pregnancy, third trimester: Secondary | ICD-10-CM

## 2017-11-04 LAB — COMPREHENSIVE METABOLIC PANEL
ALT: 11 U/L — ABNORMAL LOW (ref 14–54)
ANION GAP: 11 (ref 5–15)
AST: 16 U/L (ref 15–41)
Albumin: 3.1 g/dL — ABNORMAL LOW (ref 3.5–5.0)
Alkaline Phosphatase: 175 U/L — ABNORMAL HIGH (ref 38–126)
BUN: 8 mg/dL (ref 6–20)
CHLORIDE: 104 mmol/L (ref 101–111)
CO2: 20 mmol/L — AB (ref 22–32)
Calcium: 8.8 mg/dL — ABNORMAL LOW (ref 8.9–10.3)
Creatinine, Ser: 0.47 mg/dL (ref 0.44–1.00)
GFR calc non Af Amer: >60 mL/min (ref >60)
Glucose, Bld: 88 mg/dL (ref 65–99)
POTASSIUM: 3.9 mmol/L (ref 3.5–5.1)
SODIUM: 135 mmol/L (ref 135–145)
Total Bilirubin: 0.3 mg/dL (ref 0.3–1.2)
Total Protein: 7.4 g/dL (ref 6.5–8.1)

## 2017-11-04 LAB — CBC
HCT: 32 % — ABNORMAL LOW (ref 36.0–46.0)
Hemoglobin: 10.3 g/dL — ABNORMAL LOW (ref 12.0–15.0)
MCH: 20.4 pg — ABNORMAL LOW (ref 26.0–34.0)
MCHC: 32.2 g/dL (ref 30.0–36.0)
MCV: 63.5 fL — AB (ref 78.0–100.0)
PLATELETS: 187 10*3/uL (ref 150–400)
RBC: 5.04 MIL/uL (ref 3.87–5.11)
RDW: 17.5 % — ABNORMAL HIGH (ref 11.5–15.5)
WBC: 8.5 10*3/uL (ref 4.0–10.5)

## 2017-11-04 LAB — TYPE AND SCREEN
ABO/RH(D): O POS
Antibody Screen: NEGATIVE

## 2017-11-04 LAB — OB RESULTS CONSOLE GBS: GBS: POSITIVE

## 2017-11-04 MED ORDER — FENTANYL 2.5 MCG/ML BUPIVACAINE 1/10 % EPIDURAL INFUSION (WH - ANES)
14.0000 mL/h | INTRAMUSCULAR | Status: DC | PRN
  Administered 2017-11-05: 14 mL/h via EPIDURAL
  Filled 2017-11-04: qty 100

## 2017-11-04 MED ORDER — LIDOCAINE HCL (PF) 1 % IJ SOLN: 30.0000 mL | INTRAMUSCULAR | Status: DC | PRN

## 2017-11-04 MED ORDER — LACTATED RINGERS IV SOLN: 500.0000 mL | Freq: Once | INTRAVENOUS | Status: DC

## 2017-11-04 MED ORDER — OXYTOCIN BOLUS FROM INFUSION
500.0000 mL | Freq: Once | INTRAVENOUS | Status: AC
  Administered 2017-11-05: 500 mL via INTRAVENOUS

## 2017-11-04 MED ORDER — PHENYLEPHRINE 40 MCG/ML (10ML) SYRINGE FOR IV PUSH (FOR BLOOD PRESSURE SUPPORT): 80.0000 ug | PREFILLED_SYRINGE | INTRAVENOUS | Status: DC | PRN

## 2017-11-04 MED ORDER — SODIUM CHLORIDE 0.9 % IV SOLN
5.0000 10*6.[IU] | Freq: Once | INTRAVENOUS | Status: AC
  Administered 2017-11-04: 5 10*6.[IU] via INTRAVENOUS
  Filled 2017-11-04: qty 5

## 2017-11-04 MED ORDER — FENTANYL CITRATE (PF) 100 MCG/2ML IJ SOLN: 50.0000 ug | INTRAMUSCULAR | Status: DC | PRN

## 2017-11-04 MED ORDER — OXYTOCIN 40 UNITS IN LACTATED RINGERS INFUSION - SIMPLE MED
1.0000 m[IU]/min | INTRAVENOUS | Status: DC
  Administered 2017-11-04: 2 m[IU]/min via INTRAVENOUS
  Administered 2017-11-05: 10 m[IU]/min via INTRAVENOUS
  Filled 2017-11-04: qty 1000

## 2017-11-04 MED ORDER — EPHEDRINE 5 MG/ML INJ: 10.0000 mg | INTRAVENOUS | Status: DC | PRN

## 2017-11-04 MED ORDER — ACETAMINOPHEN 325 MG PO TABS: 650.0000 mg | ORAL_TABLET | ORAL | Status: DC | PRN

## 2017-11-04 MED ORDER — OXYTOCIN 40 UNITS IN LACTATED RINGERS INFUSION - SIMPLE MED
2.5000 [IU]/h | INTRAVENOUS | Status: DC
  Filled 2017-11-04: qty 1000

## 2017-11-04 MED ORDER — LACTATED RINGERS IV SOLN
INTRAVENOUS | Status: DC
  Administered 2017-11-04: 16:00:00 via INTRAVENOUS

## 2017-11-04 MED ORDER — ONDANSETRON HCL 4 MG/2ML IJ SOLN: 4.0000 mg | Freq: Four times a day (QID) | INTRAMUSCULAR | Status: DC | PRN

## 2017-11-04 MED ORDER — OXYCODONE-ACETAMINOPHEN 5-325 MG PO TABS: 2.0000 | ORAL_TABLET | ORAL | Status: DC | PRN

## 2017-11-04 MED ORDER — PHENYLEPHRINE 40 MCG/ML (10ML) SYRINGE FOR IV PUSH (FOR BLOOD PRESSURE SUPPORT)
80.0000 ug | PREFILLED_SYRINGE | INTRAVENOUS | Status: DC | PRN
  Filled 2017-11-04: qty 10

## 2017-11-04 MED ORDER — LACTATED RINGERS IV SOLN: 500.0000 mL | INTRAVENOUS | Status: DC | PRN

## 2017-11-04 MED ORDER — MISOPROSTOL 25 MCG QUARTER TABLET
25.0000 ug | ORAL_TABLET | ORAL | Status: DC | PRN
  Administered 2017-11-04: 25 ug via VAGINAL
  Filled 2017-11-04: qty 1

## 2017-11-04 MED ORDER — TERBUTALINE SULFATE 1 MG/ML IJ SOLN: 0.2500 mg | Freq: Once | INTRAMUSCULAR | Status: DC | PRN

## 2017-11-04 MED ORDER — SOD CITRATE-CITRIC ACID 500-334 MG/5ML PO SOLN: 30.0000 mL | ORAL | Status: DC | PRN

## 2017-11-04 MED ORDER — OXYCODONE-ACETAMINOPHEN 5-325 MG PO TABS: 1.0000 | ORAL_TABLET | ORAL | Status: DC | PRN

## 2017-11-04 MED ORDER — DIPHENHYDRAMINE HCL 50 MG/ML IJ SOLN: 12.5000 mg | INTRAMUSCULAR | Status: DC | PRN

## 2017-11-04 MED ORDER — TERBUTALINE SULFATE 1 MG/ML IJ SOLN
0.2500 mg | Freq: Once | INTRAMUSCULAR | Status: DC | PRN
Start: 2017-11-04 — End: 2017-11-05

## 2017-11-04 MED ORDER — PENICILLIN G POT IN DEXTROSE 60000 UNIT/ML IV SOLN
3.0000 10*6.[IU] | INTRAVENOUS | Status: DC
  Administered 2017-11-04 – 2017-11-05 (×4): 3 10*6.[IU] via INTRAVENOUS
  Filled 2017-11-04 (×5): qty 50

## 2017-11-04 NOTE — Anesthesia Pain Management Evaluation Note (Signed)
  CRNA Pain Management Visit Note  Patient: Meagan Hall, 27 y.o., female  "Hello [facility] am a [facility]. We have an anesthesia team available at all times to provide care throughout the hospital, including epidural management and anesthesia for C-section. I don't know your plan for the delivery whether it a natural birth, water birth, IV sedation, nitrous supplementation, doula or epidural, but we want to meet your pain goals."   1.Was your pain managed to your expectations on prior hospitalizations?   Yes   2.What is your expectation for pain management during this hospitalization?     Epidural  3.How can we help you reach that goal? unsure  Record the patient's initial score and the patient's pain goal.   Pain: 3  Pain Goal: 6 [facility] wants you to be able to say your pain was always managed very well.  Cephus ShellingBURGER,Candance Bohlman 11/04/2017

## 2017-11-04 NOTE — H&P (Signed)
27 y.o. [name] @ 4782w3d presents for IOL for cholestasis of pregnancy.  She was seen in earlier this week with generalized itching, including palms and soles.  Bile acids returned elevated at 12.5.  LFTs were normal.  Otherwise has good fetal movement and no bleeding.  Pregnancy c/b: 1. H/o PPH w last delivery: secondary to uterine atony.    Past Medical History:  Diagnosis Date  . Anemia     Past Surgical History:  Procedure Laterality Date  . UMBILICAL HERNIA REPAIR      OB History  Gravida Para Term Preterm AB Living  6 4 4   1 4   SAB TAB Ectopic Multiple Live Births    1   0 4    # Outcome Date GA Lbr Len/2nd Weight Sex Delivery Anes PTL Lv  6 Current           5 Term 12/21/14 8351w2d / 00:42 3.915 kg (8 lb 10.1 oz) M Vag-Spont EPI  LIV     Birth Comments: Hgb, normal, FA Newborn Screen Barcode: [id number] Date Collected: 12/22/2014  4 Term 02/13/10    F    LIV  3 Term 07/30/07    M    LIV  2 Term 08/17/06    M    LIV  1 TAB             Social History   Socioeconomic History  . Marital status: Single    Spouse name: Not on file  Occupational History  . Not on file  Tobacco Use  . Smoking status: Never Smoker  . Smokeless tobacco: Never Used  Substance and Sexual Activity  . Alcohol use: No  . Drug use: No  . Sexual activity: Yes   Patient has no known allergies.    Prenatal Transfer Tool  Maternal Diabetes: No Genetic Screening: Declined Maternal Ultrasounds/Referrals: Normal Fetal Ultrasounds or other Referrals:  None Maternal Substance Abuse:  No Significant Maternal Medications:  None Significant Maternal Lab Results: Lab values include: Group B Strep positive  ABO, Rh: --/--/O POS (06/14 1551) Antibody: NEG (06/14 1551) Rubella:  Immune RPR:   NR HBsAg:   Neg HIV:   Neg GBS: Positive (06/14 0000)      Vitals:   11/04/17 1541  BP: 118/68  Pulse: (!) 103  Temp: 98.2 F (36.8 C)     General:  NAD Abdomen:  soft, gravid, EFW 6.5-7# Ex:  no  edema SVE:  1/50/high, FB placed without difficulty and filled with 60 cc NS FHTs:  Cat 1 Toco:  quiet   A/P   27 y.o. [name] 3882w3d presents with IOL for cholestasis of pregnancy Check LFTs now IOL with FB/cytotec History of PPH--uterotonic at bedside for delivery.  Starting hgb 10.3, T&S active  FSR/ vtx/ GBS positive--rx pcn  Meagan Hall Meagan Hall Meagan Hall

## 2017-11-05 ENCOUNTER — Inpatient Hospital Stay (HOSPITAL_COMMUNITY): Payer: Medicaid Other | Admitting: Anesthesiology

## 2017-11-05 LAB — RPR: RPR Ser Ql: NONREACTIVE

## 2017-11-05 MED ORDER — ONDANSETRON HCL 4 MG PO TABS: 4.0000 mg | ORAL_TABLET | ORAL | Status: DC | PRN

## 2017-11-05 MED ORDER — PHENYLEPHRINE 40 MCG/ML (10ML) SYRINGE FOR IV PUSH (FOR BLOOD PRESSURE SUPPORT): 80.0000 ug | PREFILLED_SYRINGE | INTRAVENOUS | Status: DC | PRN

## 2017-11-05 MED ORDER — PRENATAL MULTIVITAMIN CH
1.0000 | ORAL_TABLET | Freq: Every day | ORAL | Status: DC
  Administered 2017-11-06 – 2017-11-07 (×2): 1 via ORAL
  Filled 2017-11-05 (×2): qty 1

## 2017-11-05 MED ORDER — SIMETHICONE 80 MG PO CHEW: 80.0000 mg | CHEWABLE_TABLET | ORAL | Status: DC | PRN

## 2017-11-05 MED ORDER — ACETAMINOPHEN 325 MG PO TABS
650.0000 mg | ORAL_TABLET | ORAL | Status: DC | PRN
  Administered 2017-11-05: 650 mg via ORAL
  Filled 2017-11-05: qty 2

## 2017-11-05 MED ORDER — LIDOCAINE HCL (PF) 1 % IJ SOLN
INTRAMUSCULAR | Status: DC | PRN
  Administered 2017-11-05 (×2): 5 mL via EPIDURAL

## 2017-11-05 MED ORDER — BENZOCAINE-MENTHOL 20-0.5 % EX AERO
1.0000 "application " | INHALATION_SPRAY | CUTANEOUS | Status: DC | PRN
  Administered 2017-11-06: 1 via TOPICAL
  Filled 2017-11-05: qty 56

## 2017-11-05 MED ORDER — MISOPROSTOL 200 MCG PO TABS
1000.0000 ug | ORAL_TABLET | Freq: Once | ORAL | Status: DC
  Filled 2017-11-05: qty 5

## 2017-11-05 MED ORDER — EPHEDRINE 5 MG/ML INJ: 10.0000 mg | INTRAVENOUS | Status: DC | PRN

## 2017-11-05 MED ORDER — DIBUCAINE 1 % RE OINT: 1.0000 "application " | TOPICAL_OINTMENT | RECTAL | Status: DC | PRN

## 2017-11-05 MED ORDER — COCONUT OIL OIL: 1.0000 "application " | TOPICAL_OIL | Status: DC | PRN

## 2017-11-05 MED ORDER — LACTATED RINGERS IV SOLN: 500.0000 mL | Freq: Once | INTRAVENOUS | Status: DC

## 2017-11-05 MED ORDER — IBUPROFEN 600 MG PO TABS
600.0000 mg | ORAL_TABLET | Freq: Four times a day (QID) | ORAL | Status: DC
  Administered 2017-11-05 – 2017-11-07 (×9): 600 mg via ORAL
  Filled 2017-11-05 (×9): qty 1

## 2017-11-05 MED ORDER — TETANUS-DIPHTH-ACELL PERTUSSIS 5-2.5-18.5 LF-MCG/0.5 IM SUSP: 0.5000 mL | Freq: Once | INTRAMUSCULAR | Status: DC

## 2017-11-05 MED ORDER — OXYCODONE HCL 5 MG PO TABS
5.0000 mg | ORAL_TABLET | ORAL | Status: DC | PRN
  Administered 2017-11-05: 5 mg via ORAL
  Filled 2017-11-05: qty 1

## 2017-11-05 MED ORDER — METHYLERGONOVINE MALEATE 0.2 MG/ML IJ SOLN: 0.2000 mg | Freq: Once | INTRAMUSCULAR | Status: DC

## 2017-11-05 MED ORDER — WITCH HAZEL-GLYCERIN EX PADS: 1.0000 "application " | MEDICATED_PAD | CUTANEOUS | Status: DC | PRN

## 2017-11-05 MED ORDER — ONDANSETRON HCL 4 MG/2ML IJ SOLN: 4.0000 mg | INTRAMUSCULAR | Status: DC | PRN

## 2017-11-05 MED ORDER — OXYCODONE HCL 5 MG PO TABS
10.0000 mg | ORAL_TABLET | ORAL | Status: DC | PRN
  Administered 2017-11-05 – 2017-11-06 (×2): 10 mg via ORAL
  Filled 2017-11-05 (×2): qty 2

## 2017-11-05 MED ORDER — SENNOSIDES-DOCUSATE SODIUM 8.6-50 MG PO TABS
2.0000 | ORAL_TABLET | ORAL | Status: DC
  Administered 2017-11-05 – 2017-11-06 (×2): 2 via ORAL
  Filled 2017-11-05 (×2): qty 2

## 2017-11-05 MED ORDER — DIPHENHYDRAMINE HCL 25 MG PO CAPS
25.0000 mg | ORAL_CAPSULE | Freq: Four times a day (QID) | ORAL | Status: DC | PRN
  Administered 2017-11-05: 25 mg via ORAL
  Filled 2017-11-05: qty 1

## 2017-11-05 NOTE — Progress Notes (Signed)
Epidural intact to back as a precaution, reported from [place] Craneonna Herr RN that it will be removed tomorrow morning.

## 2017-11-05 NOTE — Progress Notes (Signed)
Some mild discomfort with contractions  BP 105/76   Pulse 98   Temp 98.6 F (37 C) (Oral)   Resp 16   Ht 5\' 7"  (1.702 m)   Wt 107.7 kg (237 lb 6.4 oz)   LMP 02/15/2017   BMI 37.18 kg/m   Toco: q3 min EFM: Cat 1, + accels SVE: 4/50/-3/posterior / AROM clear fluid  A&P: g6p4 @ 37.4 with IOL for cholestasis of pregnancy Cont pitocin, not yet in active labor Epidural upon request Vtx/gbs pos on pcn FSR

## 2017-11-05 NOTE — Anesthesia Procedure Notes (Signed)
Epidural Patient location during procedure: OB Start time: 11/05/2017 4:21 AM End time: 11/05/2017 4:44 AM  Staffing Anesthesiologist: Jairo BenJackson, Glendora Clouatre, MD Performed: anesthesiologist   Preanesthetic Checklist Completed: patient identified, surgical consent, pre-op evaluation, timeout performed, IV checked, risks and benefits discussed and monitors and equipment checked  Epidural Patient position: sitting Prep: site prepped and draped and DuraPrep Patient monitoring: blood pressure, continuous pulse ox and heart rate Approach: midline Location: L2-L3 Injection technique: LOR air  Needle:  Needle type: Tuohy  Needle gauge: 17 G Needle length: 9 cm Needle insertion depth: 6 cm Catheter type: closed end flexible Catheter size: 19 Gauge Catheter at skin depth: 12 cm Test dose: negative (1% lidocaine)  Assessment Events: blood not aspirated, injection not painful, no injection resistance, negative IV test and no paresthesia  Additional Notes Pt identified in Labor room.  Monitors applied. Working IV access confirmed. Sterile prep, drape lumbar spine.  1% lido local L 3,4.  #17ga Touhy os, repeat local L 2,3 and  # 17 ga Touhy LOR air at 6 cm, cath in easily to 12 cm skin. Test dose OK, cath dosed and infusion begun.  Patient asymptomatic, VSS, no heme aspirated, tolerated well.  Meagan Hall, MDReason for block:procedure for pain

## 2017-11-05 NOTE — Anesthesia Preprocedure Evaluation (Signed)
Anesthesia Evaluation  Patient identified by MRN, date of birth, ID band Patient awake    Reviewed: Allergy & Precautions, NPO status , Patient's Chart, lab work & pertinent test results  History of Anesthesia Complications Negative for: history of anesthetic complications  Airway Mallampati: IV  TM Distance: >3 FB Neck ROM: Full    Dental  (+) Dental Advisory Given   Pulmonary neg pulmonary ROS,    breath sounds clear to auscultation       Cardiovascular negative cardio ROS   Rhythm:Regular Rate:Normal     Neuro/Psych negative neurological ROS     GI/Hepatic negative GI ROS, Neg liver ROS,   Endo/Other  Morbid obesity  Renal/GU negative Renal ROS     Musculoskeletal   Abdominal (+) + obese,   Peds  Hematology  (+) anemia , Hb 10.3, plts 187k   Anesthesia Other Findings   Reproductive/Obstetrics (+) Pregnancy                             Anesthesia Physical Anesthesia Plan  ASA: II  Anesthesia Plan: Epidural   Post-op Pain Management:    Induction:   PONV Risk Score and Plan: 2 and Treatment may vary due to age or medical condition  Airway Management Planned: Natural Airway  Additional Equipment:   Intra-op Plan:   Post-operative Plan:   Informed Consent: I have reviewed the patients History and Physical, chart, labs and discussed the procedure including the risks, benefits and alternatives for the proposed anesthesia with the patient or authorized representative who has indicated his/her understanding and acceptance.   Dental advisory given  Plan Discussed with:   Anesthesia Plan Comments: (Patient identified. Risks/Benefits/Options discussed with patient including but not limited to bleeding, infection, nerve damage, paralysis, failed block, incomplete pain control, headache, blood pressure changes, nausea, vomiting, reactions to medication both or allergic, itching  and postpartum back pain. Confirmed with bedside nurse the patient's most recent platelet count. Confirmed with patient that they are not currently taking any anticoagulation, have any bleeding history or any family history of bleeding disorders. Patient expressed understanding and wished to proceed. All questions were answered. )        Anesthesia Quick Evaluation

## 2017-11-06 LAB — CBC
HCT: 30.8 % — ABNORMAL LOW (ref 36.0–46.0)
HEMOGLOBIN: 9.8 g/dL — AB (ref 12.0–15.0)
MCH: 20.4 pg — ABNORMAL LOW (ref 26.0–34.0)
MCHC: 31.8 g/dL (ref 30.0–36.0)
MCV: 64.2 fL — ABNORMAL LOW (ref 78.0–100.0)
Platelets: 178 10*3/uL (ref 150–400)
RBC: 4.8 MIL/uL (ref 3.87–5.11)
RDW: 17.5 % — ABNORMAL HIGH (ref 11.5–15.5)
WBC: 9.4 10*3/uL (ref 4.0–10.5)

## 2017-11-06 NOTE — Progress Notes (Signed)
Mother resting in bed, has not visited infant yet today.  Encouraged mother to visit infant in NICU any time she is ready. She said she is getting caught up on rest.

## 2017-11-06 NOTE — Progress Notes (Signed)
Patient is doing well.  She is ambulating, voiding, tolerating PO.  Pain control is good.  Lochia is appropriate  Vitals:   11/05/17 1150 11/05/17 1251 11/05/17 1735 11/05/17 2049  BP: (!) 112/49 105/61 104/61 100/67  Pulse: 76 79 84 83  Resp: 18 18 20 18   Temp: 98.2 F (36.8 C) 98.1 F (36.7 C) 98 F (36.7 C) 98.1 F (36.7 C)  TempSrc: Oral Oral Oral Oral  SpO2:    100%  Weight:      Height:        NAD Fundus firm Ext: trace edema bilaterally  Lab Results  Component Value Date   WBC 8.5 11/04/2017   HGB 10.3 (L) 11/04/2017   HCT 32.0 (L) 11/04/2017   MCV 63.5 (L) 11/04/2017   PLT 187 11/04/2017    --/--/O POS (06/14 1551)/RImmune  A/P 27 y.o. [id number] PPD#1. Routine care.   Expect d/c tomorrow--baby in NICU for tachypnea / increased work of breathing.    [facility] Medical CenterDYANNA GEFFEL [employer] Tim[employer] CompanyCLARK

## 2017-11-06 NOTE — Anesthesia Postprocedure Evaluation (Signed)
Anesthesia Post Note  Patient: Meagan Hall  Procedure(s) Performed: AN AD HOC LABOR EPIDURAL     Patient location during evaluation: Mother Baby Anesthesia Type: Epidural Level of consciousness: awake Pain management: satisfactory to patient Vital Signs Assessment: post-procedure vital signs reviewed and stable Respiratory status: spontaneous breathing Cardiovascular status: stable Anesthetic complications: no    Last Vitals:  Vitals:   11/06/17 0100 11/06/17 0808  BP: 105/62 108/80  Pulse: 82 73  Resp: 18 18  Temp: 36.7 C 36.7 C  SpO2:      Last Pain:  Vitals:   11/06/17 0809  TempSrc:   PainSc: 0-No pain   Pain Goal:                 KeyCorpBURGER,Delia Sitar

## 2017-11-07 ENCOUNTER — Encounter (HOSPITAL_COMMUNITY): Payer: Self-pay

## 2017-11-07 NOTE — Discharge Summary (Signed)
Obstetric Discharge Summary Reason for Admission: induction of labor Prenatal Procedures: NST Intrapartum Procedures: spontaneous vaginal delivery Postpartum Procedures: none Complications-Operative and Postpartum: none Hemoglobin  Date Value Ref Range Status  11/06/2017 9.8 (L) 12.0 - 15.0 g/dL Final  16/10/960405/08/2014 54.010.0 g/dL Final   HCT  Date Value Ref Range Status  11/06/2017 30.8 (L) 36.0 - 46.0 % Final  09/25/2014 30 % Final     Discharge Diagnoses: Term Pregnancy-delivered  Discharge Information: Date: 11/07/2017 Activity: pelvic rest Diet: routine Medications: Ibuprofen Condition: stable Instructions: refer to practice specific booklet Discharge to: home   Newborn Data: Live born female  Birth Weight: 7 lb 7 oz (3374 g) APGAR: 8, 9  Newborn Delivery   Birth date/time:  11/05/2017 09:54:00 Delivery type:  Vaginal, Spontaneous     Home with mother.  Raelee Rossmann A 11/07/2017, 5:03 PM

## 2017-11-07 NOTE — Progress Notes (Signed)
Patient is eating, ambulating, voiding.  Pain control is good.  Vitals:   11/06/17 0808 11/06/17 1610 11/06/17 1936 11/07/17 0506  BP: 108/80 107/65 104/86 121/66  Pulse: 73 81 82 77  Resp: 18 20 18 18   Temp: 98 F (36.7 C) 97.8 F (36.6 C) 98.2 F (36.8 C) 98.3 F (36.8 C)  TempSrc: Oral Axillary Axillary Oral  SpO2:   98% 100%  Weight:      Height:        Fundus firm Perineum without swelling.  Lab Results  Component Value Date   WBC 9.4 11/06/2017   HGB 9.8 (L) 11/06/2017   HCT 30.8 (L) 11/06/2017   MCV 64.2 (L) 11/06/2017   PLT 178 11/06/2017    --/--/O POS (06/14 1551)/RI  A/P Post partum day 2.  Routine care.  Expect d/c today.    Ilija Maxim A

## 2018-07-12 IMAGING — DX DG CHEST 1V PORT
1 series · 1 of 1 positions shown · non-contrast
Comparison: 02/10/2017

CLINICAL DATA: Fever.

EXAM:
PORTABLE CHEST 1 VIEW

[chest ap]
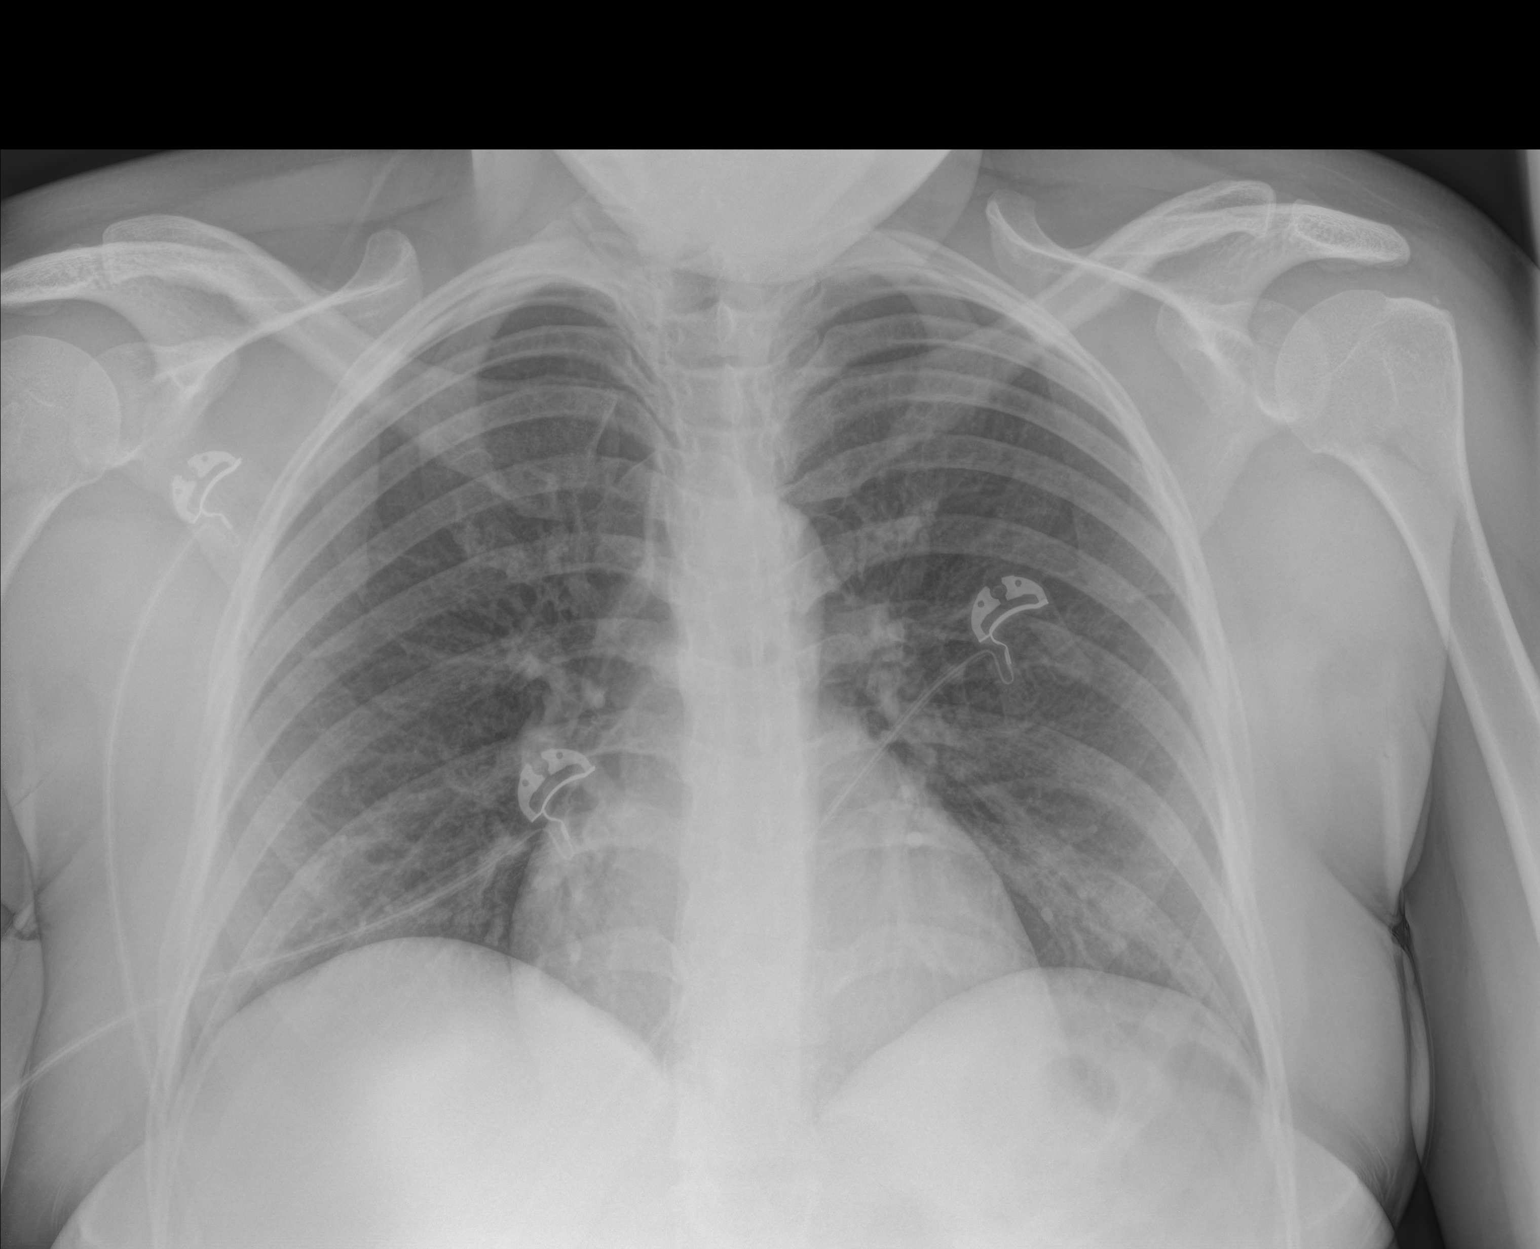

[1 of 1 positions shown; findings below may reference images not displayed]

FINDINGS: Low volume film.

The cardiomediastinal silhouette is unremarkable.

There is no evidence of focal airspace disease, pulmonary edema,
suspicious pulmonary nodule/mass, pleural effusion, or pneumothorax.

No acute bony abnormalities are identified.
IMPRESSION: No active disease.

## 2020-02-27 ENCOUNTER — Other Ambulatory Visit: Payer: Medicaid Other

## 2020-02-27 DIAGNOSIS — Z20822 Contact with and (suspected) exposure to covid-19: Secondary | ICD-10-CM

## 2020-02-29 ENCOUNTER — Telehealth: Payer: Self-pay

## 2020-02-29 LAB — NOVEL CORONAVIRUS, NAA

## 2020-02-29 NOTE — Telephone Encounter (Signed)
Patient called and she was informed that the COVID-19 test that she did was 02/27/20 would need to be repeated.  She states she had her test done because her children are positive. She will be retested.

## 2020-03-10 LAB — HM PAP SMEAR: HM Pap smear: NEGATIVE

## 2020-07-18 ENCOUNTER — Ambulatory Visit: Payer: Medicaid Other | Attending: Internal Medicine

## 2020-07-18 DIAGNOSIS — Z23 Encounter for immunization: Secondary | ICD-10-CM

## 2020-07-18 NOTE — Progress Notes (Signed)
   Covid-19 Vaccination Clinic  Name:  KATELEN LUEPKE    MRN: [id number] DOB: Mar 05, 1991  07/18/2020  Ms. Harbeck was observed post Covid-19 immunization for 15 minutes without incident. She was provided with Vaccine Information Sheet and instruction to access the V-Safe system.   Ms. Biondo was instructed to call 911 with any severe reactions post vaccine: Marland Kitchen Difficulty breathing  . Swelling of face and throat  . A fast heartbeat  . A bad rash all over body  . Dizziness and weakness   Immunizations Administered    Name Date Dose VIS Date Route   PFIZER Comrnaty(Gray TOP) Covid-19 Vaccine 07/18/2020  9:13 AM 0.3 mL 05/01/2020 Intramuscular   Manufacturer: [employer], [employer]   Lot: [id number]   NDC: [phone]

## 2020-07-21 ENCOUNTER — Other Ambulatory Visit (HOSPITAL_COMMUNITY): Payer: Self-pay | Admitting: Internal Medicine

## 2020-07-21 MED FILL — PFIZER-BIONT COVID-19 VAC-T: 30 | 21 days supply | Qty: 0 | Fill #0

## 2020-07-30 ENCOUNTER — Ambulatory Visit: Payer: Medicaid Other | Admitting: Family Medicine

## 2020-08-05 ENCOUNTER — Ambulatory Visit: Payer: Medicaid Other | Admitting: Family Medicine

## 2020-08-05 ENCOUNTER — Other Ambulatory Visit: Payer: Self-pay

## 2020-08-05 ENCOUNTER — Encounter: Payer: Self-pay | Admitting: Family Medicine

## 2020-08-05 VITALS — BP 111/69 | HR 71 | Temp 97.6°F | Ht 68.31 in | Wt 222.6 lb

## 2020-08-05 DIAGNOSIS — D649 Anemia, unspecified: Secondary | ICD-10-CM | POA: Insufficient documentation

## 2020-08-05 DIAGNOSIS — R635 Abnormal weight gain: Secondary | ICD-10-CM | POA: Diagnosis not present

## 2020-08-05 DIAGNOSIS — R5383 Other fatigue: Secondary | ICD-10-CM | POA: Insufficient documentation

## 2020-08-05 DIAGNOSIS — D509 Iron deficiency anemia, unspecified: Secondary | ICD-10-CM

## 2020-08-05 LAB — CBC
Hemoglobin: 9.7 g/dL — ABNORMAL LOW (ref 11.7–15.5)
WBC: 5.2 10*3/uL (ref 3.8–10.8)

## 2020-08-05 NOTE — Progress Notes (Signed)
Meagan Hall - 29 y.o. female MRN [id number]  Date of birth: 09-11-1990  Subjective Chief Complaint  Patient presents with  . Establish Care    HPI Meagan Hall is a 30 y.o. female here today for initial visit to establish care.  Overall has been in good health, reports history of iron deficiency anemia.  She has concerns bout fatigue today and thinks her iron may be low again.  She would like to have labs to check this as well as other potential causes of fatigue.  She does admit to poor sleep as well.  She has difficulty falling asleep as well as staying asleep.  She has not tried anything to help with this.    ROS:  A comprehensive ROS was completed and negative except as noted per HPI  No Known Allergies  Past Medical History:  Diagnosis Date  . Anemia     Past Surgical History:  Procedure Laterality Date  . OOPHORECTOMY    . UMBILICAL HERNIA REPAIR      Social History   Socioeconomic History  . Marital status: Single    Spouse name: Not on file  . Number of children: Not on file  . Years of education: Not on file  . Highest education level: Not on file  Occupational History  . Not on file  Tobacco Use  . Smoking status: Never Smoker  . Smokeless tobacco: Never Used  Vaping Use  . Vaping Use: Never used  Substance and Sexual Activity  . Alcohol use: No  . Drug use: No  . Sexual activity: Yes    Partners: Male    Birth control/protection: I.U.D.  Other Topics Concern  . Not on file  Social History Narrative  . Not on file   Social Determinants of Health   Financial Resource Strain: Not on file  Food Insecurity: Not on file  Transportation Needs: Not on file  Physical Activity: Not on file  Stress: Not on file  Social Connections: Not on file    Family History  Problem Relation Age of Onset  . Hypertension Mother   . Asthma Daughter     Health Maintenance  Topic Date Due  . Hepatitis C Screening  Never done  . PAP SMEAR-Modifier   11/05/2019  . COVID-19 Vaccine (2 - Pfizer risk 4-dose series) 08/08/2020  . INFLUENZA VACCINE  09/21/2020 (Originally 12/23/2019)  . TETANUS/TDAP  09/07/2027  . HIV Screening  Completed  . HPV VACCINES  Aged Out     ----------------------------------------------------------------------------------------------------------------------------------------------------------------------------------------------------------------- Physical Exam BP 111/69 (BP Location: Left Arm, Patient Position: Sitting, Cuff Size: Normal)   Pulse 71   Temp 97.6 F (36.4 C)   Ht 5' 8.31" (1.735 m)   Wt 222 lb 9.6 oz (101 kg)   SpO2 100%   BMI 33.54 kg/m   Physical Exam Constitutional:      Appearance: Normal appearance.  HENT:     Head: Normocephalic and atraumatic.  Eyes:     General: No scleral icterus. Cardiovascular:     Rate and Rhythm: Normal rate and regular rhythm.  Pulmonary:     Effort: Pulmonary effort is normal.     Breath sounds: Normal breath sounds.  Musculoskeletal:     Cervical back: Neck supple.  Neurological:     General: No focal deficit present.     Mental Status: She is alert.  Psychiatric:        Mood and Affect: Mood normal.  Behavior: Behavior normal.     ------------------------------------------------------------------------------------------------------------------------------------------------------------------------------------------------------------------- Assessment and Plan  Other fatigue Labs ordered today for evaluation of fatigue Orders Placed This Encounter  Procedures  . COMPLETE METABOLIC PANEL WITH GFR  . CBC  . TSH  . B12  . Fe+TIBC+Fer  . HgB A1c  Discussed strategies to help with sleep including sleep hygiene and can consider trying melatonin.     No orders of the defined types were placed in this encounter.   No follow-ups on file.    This visit occurred during the SARS-CoV-2 public health emergency.  Safety protocols were in  place, including screening questions prior to the visit, additional usage of staff PPE, and extensive cleaning of exam room while observing appropriate contact time as indicated for disinfecting solutions.

## 2020-08-05 NOTE — Assessment & Plan Note (Signed)
Labs ordered today for evaluation of fatigue Orders Placed This Encounter  Procedures  . COMPLETE METABOLIC PANEL WITH GFR  . CBC  . TSH  . B12  . Fe+TIBC+Fer  . HgB A1c  Discussed strategies to help with sleep including sleep hygiene and can consider trying melatonin.

## 2020-08-05 NOTE — Patient Instructions (Signed)
Great to meet you today!  Try melatonin 3-5 mg before bed to help with sleep.  Have labs completed.  We'll be in touch with results.

## 2020-08-06 LAB — COMPLETE METABOLIC PANEL WITH GFR
AG Ratio: 1.4 (calc) (ref 1.0–2.5)
ALT: 8 U/L (ref 6–29)
AST: 10 U/L (ref 10–30)
Albumin: 4.3 g/dL (ref 3.6–5.1)
Alkaline phosphatase (APISO): 54 U/L (ref 31–125)
BUN: 12 mg/dL (ref 7–25)
CO2: 26 mmol/L (ref 20–32)
Calcium: 9.2 mg/dL (ref 8.6–10.2)
Chloride: 107 mmol/L (ref 98–110)
Creat: 0.81 mg/dL (ref 0.50–1.10)
GFR, Est African American: 112 mL/min/{1.73_m2} (ref >=60)
GFR, Est Non African American: 97 mL/min/{1.73_m2} (ref >=60)
Globulin: 3.1 g/dL (calc) (ref 1.9–3.7)
Glucose, Bld: 94 mg/dL (ref 65–99)
Potassium: 4.4 mmol/L (ref 3.5–5.3)
Sodium: 139 mmol/L (ref 135–146)
Total Bilirubin: 0.3 mg/dL (ref 0.2–1.2)
Total Protein: 7.4 g/dL (ref 6.1–8.1)

## 2020-08-06 LAB — IRON,TIBC AND FERRITIN PANEL
%SAT: 5 % (calc) — ABNORMAL LOW (ref 16–45)
Ferritin: 3 ng/mL — ABNORMAL LOW (ref 16–154)
Iron: 21 ug/dL — ABNORMAL LOW (ref 40–190)
TIBC: 416 mcg/dL (calc) (ref 250–450)

## 2020-08-06 LAB — CBC
HCT: 33.3 % — ABNORMAL LOW (ref 35.0–45.0)
MCH: 18.4 pg — ABNORMAL LOW (ref 27.0–33.0)
MCHC: 29.1 g/dL — ABNORMAL LOW (ref 32.0–36.0)
MCV: 63.3 fL — ABNORMAL LOW (ref 80.0–100.0)
Platelets: 370 10*3/uL (ref 140–400)
RBC: 5.26 10*6/uL — ABNORMAL HIGH (ref 3.80–5.10)
RDW: 20.6 % — ABNORMAL HIGH (ref 11.0–15.0)

## 2020-08-06 LAB — HEMOGLOBIN A1C
Hgb A1c MFr Bld: 5.8 % of total Hgb — ABNORMAL HIGH (ref <5.7)
Mean Plasma Glucose: 120 mg/dL
eAG (mmol/L): 6.6 mmol/L

## 2020-08-06 LAB — VITAMIN B12: Vitamin B-12: 459 pg/mL (ref 200–1100)

## 2020-08-06 LAB — TSH: TSH: 1.22 mIU/L

## 2020-08-08 ENCOUNTER — Ambulatory Visit: Payer: Medicaid Other | Attending: Internal Medicine

## 2020-08-08 DIAGNOSIS — Z23 Encounter for immunization: Secondary | ICD-10-CM

## 2020-08-08 NOTE — Progress Notes (Signed)
   Covid-19 Vaccination Clinic  Name:  Meagan Hall    MRN: [id number] DOB: 20-Nov-1990  08/08/2020  [name] was observed post Covid-19 immunization for 15 minutes without incident. She was provided with Vaccine Information Sheet and instruction to access the V-Safe system.   [name] was instructed to call 911 with any severe reactions post vaccine: Marland Kitchen Difficulty breathing  . Swelling of face and throat  . A fast heartbeat  . A bad rash all over body  . Dizziness and weakness   Immunizations Administered    Name Date Dose VIS Date Route   PFIZER Comrnaty(Gray TOP) Covid-19 Vaccine 08/08/2020  9:20 AM 0.3 mL 05/01/2020 Intramuscular   Manufacturer: [employer], [employer]   Lot: [id number]   NDC: [phone]

## 2020-08-12 ENCOUNTER — Encounter: Payer: Self-pay | Admitting: Family Medicine

## 2020-12-24 ENCOUNTER — Ambulatory Visit (INDEPENDENT_AMBULATORY_CARE_PROVIDER_SITE_OTHER): Payer: Medicaid Other | Admitting: Family Medicine

## 2020-12-24 ENCOUNTER — Ambulatory Visit (INDEPENDENT_AMBULATORY_CARE_PROVIDER_SITE_OTHER): Payer: Medicaid Other

## 2020-12-24 ENCOUNTER — Encounter: Payer: Self-pay | Admitting: Family Medicine

## 2020-12-24 ENCOUNTER — Ambulatory Visit: Payer: Medicaid Other | Attending: Internal Medicine

## 2020-12-24 ENCOUNTER — Other Ambulatory Visit: Payer: Self-pay

## 2020-12-24 VITALS — BP 128/79 | HR 63 | Temp 98.0°F | Ht 67.0 in | Wt 214.0 lb

## 2020-12-24 DIAGNOSIS — M25562 Pain in left knee: Secondary | ICD-10-CM

## 2020-12-24 DIAGNOSIS — M25561 Pain in right knee: Secondary | ICD-10-CM

## 2020-12-24 DIAGNOSIS — G8929 Other chronic pain: Secondary | ICD-10-CM

## 2020-12-24 DIAGNOSIS — Z23 Encounter for immunization: Secondary | ICD-10-CM

## 2020-12-24 MED ORDER — MELOXICAM 7.5 MG PO TABS: 7.5000 mg | ORAL_TABLET | Freq: Every day | ORAL | 0 refills | Status: AC

## 2020-12-24 NOTE — Progress Notes (Signed)
   Covid-19 Vaccination Clinic  Name:  Meagan Hall    MRN: [id number] DOB: 08-23-1990  12/24/2020  [name] was observed post Covid-19 immunization for 15 minutes without incident. She was provided with Vaccine Information Sheet and instruction to access the V-Safe system.   [name] was instructed to call 911 with any severe reactions post vaccine: Difficulty breathing  Swelling of face and throat  A fast heartbeat  A bad rash all over body  Dizziness and weakness   Immunizations Administered     Name Date Dose VIS Date Route   PFIZER Comrnaty(Gray TOP) Covid-19 Vaccine 12/24/2020  9:13 AM 0.3 mL 05/01/2020 Intramuscular   Manufacturer: [employer], [employer]   Lot: [id number]   NDC: [phone]

## 2020-12-24 NOTE — Assessment & Plan Note (Signed)
Her exam seems to be consistent with patellofemoral syndrome.  Recommend starting meloxicam daily as needed.  X-rays of knees ordered today.  Given handout for exercises for quad strengthening.

## 2020-12-24 NOTE — Progress Notes (Signed)
Meagan Hall - 31 y.o. female MRN [id number]  Date of birth: April 26, 1991  Subjective Chief Complaint  Patient presents with   Knee Pain    HPI Meagan Hall is a 30 year old female here today with complaint of bilateral knee pain.  She has had this for a few months.  Worsened recently.  She denies any known injury or overuse.  It does bother her to exercise.  Pain is worse with bending the knee and exercises such as squats or higher impact exercises.  She has noted some grinding sensation with flexion.  She has not had any swelling of the knee.  ROS:  A comprehensive ROS was completed and negative except as noted per HPI      Past Medical History:  Diagnosis Date   Anemia     Past Surgical History:  Procedure Laterality Date   OOPHORECTOMY     UMBILICAL HERNIA REPAIR      Social History   Socioeconomic History   Marital status: Single    Spouse name: Not on file   Number of children: Not on file   Years of education: Not on file   Highest education level: Not on file  Occupational History   Not on file  Tobacco Use   Smoking status: Never   Smokeless tobacco: Never  Vaping Use   Vaping Use: Never used  Substance and Sexual Activity   Alcohol use: No   Drug use: No   Sexual activity: Yes    Partners: Male    Birth control/protection: I.U.D.  Other Topics Concern   Not on file  Social History Narrative   Not on file   Social Determinants of Health   Financial Resource Strain: Not on file  Food Insecurity: Not on file  Transportation Needs: Not on file  Physical Activity: Not on file  Stress: Not on file  Social Connections: Not on file    Family History  Problem Relation Age of Onset   Hypertension Mother    Asthma Daughter     Health Maintenance  Topic Date Due   Pneumococcal Vaccine 84-45 Years old (1 - PCV) Never done   Hepatitis C Screening  Never done   INFLUENZA VACCINE  12/22/2020   COVID-19 Vaccine (4 - Booster for Pfizer series)  03/26/2021   PAP SMEAR-Modifier  03/10/2025   TETANUS/TDAP  09/07/2027   HIV Screening  Completed   HPV VACCINES  Aged Out     ----------------------------------------------------------------------------------------------------------------------------------------------------------------------------------------------------------------- Physical Exam BP 128/79   Pulse 63   Temp 98 F (36.7 C)   Ht 5\' 7"  (1.702 m)   Wt 214 lb (97.1 kg)   LMP 11/30/2020   SpO2 99%   BMI 33.52 kg/m   Physical Exam Constitutional:      Appearance: Normal appearance.  HENT:     Head: Normocephalic and atraumatic.  Musculoskeletal:     Cervical back: Neck supple.     Comments: Bilateral knees normal to inspection without effusion.  She does have tenderness adjacent to the patella bilaterally.  She also has increased pain and crepitus with patellar compression.  Her range of motion is fairly good with some pain on deep flexion.  Negative meniscal provocation testing.  Neurological:     Mental Status: She is alert.    ------------------------------------------------------------------------------------------------------------------------------------------------------------------------------------------------------------------- Assessment and Plan  Chronic pain of both knees Her exam seems to be consistent with patellofemoral syndrome.  Recommend starting meloxicam daily as needed.  X-rays of knees ordered today.  Given handout for  exercises for quad strengthening.   Meds ordered this encounter  Medications   meloxicam (MOBIC) 7.5 MG tablet    Sig: Take 1-2 tablets (7.5-15 mg total) by mouth daily.    Dispense:  30 tablet    Refill:  0    No follow-ups on file.    This visit occurred during the SARS-CoV-2 public health emergency.  Safety protocols were in place, including screening questions prior to the visit, additional usage of staff PPE, and extensive cleaning of exam room while observing  appropriate contact time as indicated for disinfecting solutions.

## 2020-12-25 ENCOUNTER — Other Ambulatory Visit (HOSPITAL_BASED_OUTPATIENT_CLINIC_OR_DEPARTMENT_OTHER): Payer: Self-pay

## 2020-12-25 MED ORDER — PFIZER-BIONT COVID-19 VAC-TRIS 30 MCG/0.3ML IM SUSP
INTRAMUSCULAR | 0 refills | Status: AC
  Filled 2020-12-25: qty 0.3, 1d supply, fill #0

## 2021-01-01 ENCOUNTER — Telehealth: Payer: Self-pay

## 2021-01-01 NOTE — Telephone Encounter (Signed)
Pt called for results. States no one attempted to contact her with the results.   Per the notes, Patient was contacted twice for results and messages left. Confirmed the correct phone numbers on file.   Patient has been advised of results and recommendations. If symptoms worsen, pt is to contact the office and be directed to Ortho.

## 2022-05-31 ENCOUNTER — Other Ambulatory Visit: Payer: Self-pay

## 2022-05-31 ENCOUNTER — Encounter (HOSPITAL_COMMUNITY): Payer: Self-pay

## 2022-05-31 ENCOUNTER — Emergency Department (HOSPITAL_COMMUNITY)
Admission: EM | Admit: 2022-05-31 | Discharge: 2022-06-01 | Discharge status: Against Medical Advice | Payer: Medicaid Other | Attending: Physician Assistant | Admitting: Physician Assistant

## 2022-05-31 DIAGNOSIS — M545 Low back pain, unspecified: Secondary | ICD-10-CM | POA: Diagnosis not present

## 2022-05-31 DIAGNOSIS — R32 Unspecified urinary incontinence: Secondary | ICD-10-CM | POA: Diagnosis not present

## 2022-05-31 DIAGNOSIS — Y92410 Unspecified street and highway as the place of occurrence of the external cause: Secondary | ICD-10-CM | POA: Diagnosis not present

## 2022-05-31 MED ORDER — IBUPROFEN 800 MG PO TABS
800.0000 mg | ORAL_TABLET | Freq: Once | ORAL | Status: AC
  Administered 2022-05-31: 800 mg via ORAL
  Filled 2022-05-31: qty 1
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
## 2022-05-31 NOTE — ED Notes (Signed)
NA for bed assignment x1
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
## 2022-06-01 NOTE — ED Notes (Signed)
No answer for bed assignment x2

## 2022-06-04 ENCOUNTER — Telehealth: Payer: Self-pay | Admitting: General Practice

## 2022-06-04 NOTE — Telephone Encounter (Signed)
Transition Care Management Unsuccessful Follow-up Telephone Call  Date of discharge and from where:  06/01/22 from [facility] [place]  Attempts:  1st Attempt  Reason for unsuccessful TCM follow-up call:  Left voice message

## 2022-06-07 NOTE — Telephone Encounter (Signed)
Transition Care Management Unsuccessful Follow-up Telephone Call  Date of discharge and from where:  06/01/22 from [facility]  Attempts:  2nd Attempt  Reason for unsuccessful TCM follow-up call:  Left voice message

## 2022-06-10 NOTE — Telephone Encounter (Signed)
Transition Care Management Unsuccessful Follow-up Telephone Call  Date of discharge and from where:  06/01/22 from [facility] Meagan Hall  Attempts:  3rd Attempt  Reason for unsuccessful TCM follow-up call:  No answer/busy

## 2022-09-13 ENCOUNTER — Ambulatory Visit (LOCAL_COMMUNITY_HEALTH_CENTER): Payer: Self-pay

## 2022-09-13 ENCOUNTER — Ambulatory Visit: Payer: Self-pay

## 2022-09-13 DIAGNOSIS — Z111 Encounter for screening for respiratory tuberculosis: Secondary | ICD-10-CM

## 2022-09-13 DIAGNOSIS — Z7185 Encounter for immunization safety counseling: Secondary | ICD-10-CM

## 2022-09-13 NOTE — Progress Notes (Signed)
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  PPD placed.   Has [facility] record with limited vaccines. No childhood vaccine record with her and she says she had vaccines as child in [name] and medical office no longer in practice. During appt, patient placed phone call to [name] to verify if MMR and Varicella titers were done with past pregnancies. [name] staff reports no varicella titer and only rubella titer was done in past.  Phone call was placed to [name], [name] [phone]) by patient and RN spoke to [name], school nurse. [name] recommends email to be sent to her requesting vaccine record and she will email record to patient. ([name]). Ms Sondra Come states faxes cannot be sent, only email.  Patient states plans to send email to obtain record.   Patient signed ROI for [facility] to attempt to obtain vaccine record as well.  RN notified [facility] imm coordinator A Widderich, RN who securely emailed vaccine request and ROI to school nurse in order to obtain vaccine record.   Pt to return 09/16/2022 8:15 am for PPDR and vaccines. Has reminder. Jerel Shepherd, RN

## 2022-09-14 ENCOUNTER — Encounter: Payer: Medicaid Other | Admitting: Family Medicine

## 2022-09-16 ENCOUNTER — Ambulatory Visit: Payer: Medicaid Other

## 2022-09-16 ENCOUNTER — Ambulatory Visit (LOCAL_COMMUNITY_HEALTH_CENTER): Payer: Medicaid Other

## 2022-09-16 DIAGNOSIS — Z111 Encounter for screening for respiratory tuberculosis: Secondary | ICD-10-CM

## 2022-09-16 DIAGNOSIS — Z7185 Encounter for immunization safety counseling: Secondary | ICD-10-CM

## 2022-09-16 DIAGNOSIS — Z23 Encounter for immunization: Secondary | ICD-10-CM

## 2022-09-16 LAB — TB SKIN TEST
Induration: 0 mm
TB Skin Test: NEGATIVE

## 2022-09-16 NOTE — Progress Notes (Signed)
In nurse clinic for PPDR ( 0 mm / negative) and Varicella vaccine.   Presents vaccine record from [name] and vaccines added to [employer]. Varicella given today as needed for school (healthcare). Tolerated vaccine well. Updated [facility] copy given and explained. Jerel Shepherd, RN

## 2022-09-27 ENCOUNTER — Ambulatory Visit (LOCAL_COMMUNITY_HEALTH_CENTER): Payer: Medicaid Other

## 2022-09-27 DIAGNOSIS — Z719 Counseling, unspecified: Secondary | ICD-10-CM

## 2022-09-27 NOTE — Progress Notes (Signed)
In nurse clinic for 2nd step PPD, however patient had Varicella vaccine (Live vaccine) on 09/16/22 and must wait 28 days until she is able to have next PPD. This was explained to patient. Questions answered and reports understanding.   Patient also has questions about when 3rd dose of Hep B is due. Based on [facility] record, Hep B dose #2 given 09/08/22 and Hep B #3 will be due 8 weeks after this dose. Earliest for next Hep B dose is 11/03/2022. This was explained to patient. Questions answered and reports understanding.   Patient plans to schedule these appts with clerk.Jerel Shepherd, RN

## 2022-09-30 ENCOUNTER — Other Ambulatory Visit: Payer: Medicaid Other

## 2022-10-19 ENCOUNTER — Other Ambulatory Visit: Payer: Medicaid Other

## 2022-10-19 ENCOUNTER — Ambulatory Visit: Payer: Medicaid Other

## 2022-10-25 ENCOUNTER — Ambulatory Visit (LOCAL_COMMUNITY_HEALTH_CENTER): Payer: Medicaid Other

## 2022-10-25 DIAGNOSIS — Z111 Encounter for screening for respiratory tuberculosis: Secondary | ICD-10-CM

## 2022-10-28 ENCOUNTER — Ambulatory Visit (LOCAL_COMMUNITY_HEALTH_CENTER): Payer: Self-pay

## 2022-10-28 DIAGNOSIS — Z111 Encounter for screening for respiratory tuberculosis: Secondary | ICD-10-CM

## 2022-10-28 LAB — TB SKIN TEST
Induration: 0 mm
TB Skin Test: NEGATIVE

## 2023-01-20 ENCOUNTER — Other Ambulatory Visit (HOSPITAL_COMMUNITY): Payer: Self-pay

## 2024-04-05 ENCOUNTER — Other Ambulatory Visit: Payer: Self-pay

## 2024-04-24 ENCOUNTER — Other Ambulatory Visit: Payer: Self-pay | Admitting: Obstetrics

## 2024-04-24 DIAGNOSIS — R2231 Localized swelling, mass and lump, right upper limb: Secondary | ICD-10-CM

## 2024-05-18 ENCOUNTER — Ambulatory Visit
Admission: RE | Admit: 2024-05-18 | Discharge: 2024-05-18 | Disposition: A | Discharge status: Home / Self Care | Source: Ambulatory Visit | Attending: Obstetrics | Admitting: Obstetrics

## 2024-05-18 DIAGNOSIS — R2231 Localized swelling, mass and lump, right upper limb: Secondary | ICD-10-CM
